# Patient Record
Sex: Female | Born: 1965
Health system: Southern US, Community
[De-identification: ages and names within clinical notes are randomized; demographics above are authoritative.]

## PROBLEM LIST (undated history)

## (undated) DIAGNOSIS — C4491 Basal cell carcinoma of skin, unspecified: Secondary | ICD-10-CM

## (undated) DIAGNOSIS — N809 Endometriosis, unspecified: Secondary | ICD-10-CM

## (undated) DIAGNOSIS — G43909 Migraine, unspecified, not intractable, without status migrainosus: Secondary | ICD-10-CM

## (undated) DIAGNOSIS — B2790 Infectious mononucleosis, unspecified without complication: Secondary | ICD-10-CM

## (undated) DIAGNOSIS — D649 Anemia, unspecified: Secondary | ICD-10-CM

## (undated) DIAGNOSIS — I10 Essential (primary) hypertension: Secondary | ICD-10-CM

## (undated) DIAGNOSIS — F32A Depression, unspecified: Secondary | ICD-10-CM

## (undated) DIAGNOSIS — F329 Major depressive disorder, single episode, unspecified: Secondary | ICD-10-CM

## (undated) DIAGNOSIS — E785 Hyperlipidemia, unspecified: Secondary | ICD-10-CM

## (undated) DIAGNOSIS — N200 Calculus of kidney: Secondary | ICD-10-CM

## (undated) DIAGNOSIS — F419 Anxiety disorder, unspecified: Secondary | ICD-10-CM

## (undated) DIAGNOSIS — G629 Polyneuropathy, unspecified: Secondary | ICD-10-CM

## (undated) HISTORY — DX: Polyneuropathy, unspecified: G62.9

## (undated) HISTORY — DX: Basal cell carcinoma of skin, unspecified: C44.91

## (undated) HISTORY — PX: ABDOMINAL HYSTERECTOMY: SHX81

## (undated) HISTORY — DX: Infectious mononucleosis, unspecified without complication: B27.90

## (undated) HISTORY — DX: Migraine, unspecified, not intractable, without status migrainosus: G43.909

## (undated) HISTORY — PX: DIAGNOSTIC LAPAROSCOPY: SUR761

## (undated) HISTORY — DX: Calculus of kidney: N20.0

## (undated) HISTORY — DX: Hyperlipidemia, unspecified: E78.5

## (undated) HISTORY — DX: Depression, unspecified: F32.A

## (undated) HISTORY — PX: ELBOW SURGERY: SHX618

## (undated) HISTORY — DX: Endometriosis, unspecified: N80.9

## (undated) HISTORY — PX: OTHER SURGICAL HISTORY: SHX169

## (undated) HISTORY — DX: Major depressive disorder, single episode, unspecified: F32.9

## (undated) HISTORY — DX: Anemia, unspecified: D64.9

## (undated) HISTORY — DX: Essential (primary) hypertension: I10

## (undated) HISTORY — PX: LITHOTRIPSY: SUR834

## (undated) HISTORY — DX: Anxiety disorder, unspecified: F41.9

---

## 1992-01-03 DIAGNOSIS — O139 Gestational [pregnancy-induced] hypertension without significant proteinuria, unspecified trimester: Secondary | ICD-10-CM

## 2003-11-11 HISTORY — PX: TOTAL ABDOMINAL HYSTERECTOMY W/ BILATERAL SALPINGOOPHORECTOMY: SHX83

## 2004-07-19 ENCOUNTER — Other Ambulatory Visit: Payer: Self-pay

## 2005-10-29 ENCOUNTER — Ambulatory Visit: Payer: Self-pay | Admitting: Urology

## 2006-02-05 ENCOUNTER — Ambulatory Visit: Payer: Self-pay | Admitting: Urology

## 2006-10-26 ENCOUNTER — Ambulatory Visit: Payer: Self-pay | Admitting: Urology

## 2007-08-19 ENCOUNTER — Ambulatory Visit: Payer: Self-pay | Admitting: Unknown Physician Specialty

## 2007-10-27 ENCOUNTER — Ambulatory Visit: Payer: Self-pay | Admitting: Urology

## 2008-04-06 ENCOUNTER — Ambulatory Visit: Payer: Self-pay | Admitting: Unknown Physician Specialty

## 2008-04-11 ENCOUNTER — Other Ambulatory Visit: Payer: Self-pay

## 2008-04-11 ENCOUNTER — Ambulatory Visit: Payer: Self-pay | Admitting: Unknown Physician Specialty

## 2008-04-13 ENCOUNTER — Ambulatory Visit: Payer: Self-pay | Admitting: Unknown Physician Specialty

## 2008-08-22 ENCOUNTER — Ambulatory Visit: Payer: Self-pay | Admitting: Unknown Physician Specialty

## 2008-09-05 ENCOUNTER — Ambulatory Visit: Payer: Self-pay | Admitting: Urology

## 2008-10-25 ENCOUNTER — Ambulatory Visit: Payer: Self-pay | Admitting: Urology

## 2009-10-24 ENCOUNTER — Ambulatory Visit: Payer: Self-pay | Admitting: Unknown Physician Specialty

## 2009-10-24 ENCOUNTER — Ambulatory Visit: Payer: Self-pay | Admitting: Urology

## 2009-11-01 ENCOUNTER — Ambulatory Visit: Payer: Self-pay | Admitting: Urology

## 2009-11-16 ENCOUNTER — Ambulatory Visit: Payer: Self-pay | Admitting: Urology

## 2010-03-25 ENCOUNTER — Ambulatory Visit: Payer: Self-pay | Admitting: Urology

## 2010-05-03 ENCOUNTER — Ambulatory Visit: Payer: Self-pay | Admitting: Urology

## 2010-10-25 ENCOUNTER — Ambulatory Visit: Payer: Self-pay | Admitting: Unknown Physician Specialty

## 2010-11-10 HISTORY — PX: OTHER SURGICAL HISTORY: SHX169

## 2010-11-10 HISTORY — PX: REDUCTION MAMMAPLASTY: SUR839

## 2010-12-11 ENCOUNTER — Ambulatory Visit: Payer: Self-pay | Admitting: Anesthesiology

## 2010-12-17 ENCOUNTER — Ambulatory Visit: Payer: Self-pay

## 2011-06-02 ENCOUNTER — Ambulatory Visit: Payer: Self-pay | Admitting: Urology

## 2011-10-29 ENCOUNTER — Ambulatory Visit: Payer: Self-pay | Admitting: Unknown Physician Specialty

## 2012-06-09 ENCOUNTER — Ambulatory Visit: Payer: Self-pay | Admitting: Urology

## 2012-11-10 HISTORY — PX: BASAL CELL CARCINOMA EXCISION: SHX1214

## 2013-12-02 ENCOUNTER — Ambulatory Visit: Payer: Self-pay | Admitting: Urology

## 2016-11-18 DIAGNOSIS — M5030 Other cervical disc degeneration, unspecified cervical region: Secondary | ICD-10-CM | POA: Diagnosis not present

## 2016-11-18 DIAGNOSIS — M5412 Radiculopathy, cervical region: Secondary | ICD-10-CM | POA: Diagnosis not present

## 2016-12-04 DIAGNOSIS — M792 Neuralgia and neuritis, unspecified: Secondary | ICD-10-CM | POA: Diagnosis not present

## 2016-12-04 DIAGNOSIS — G894 Chronic pain syndrome: Secondary | ICD-10-CM | POA: Diagnosis not present

## 2016-12-04 DIAGNOSIS — G8928 Other chronic postprocedural pain: Secondary | ICD-10-CM | POA: Diagnosis not present

## 2017-01-02 DIAGNOSIS — J209 Acute bronchitis, unspecified: Secondary | ICD-10-CM | POA: Diagnosis not present

## 2017-01-02 DIAGNOSIS — R69 Illness, unspecified: Secondary | ICD-10-CM | POA: Diagnosis not present

## 2017-01-16 ENCOUNTER — Other Ambulatory Visit: Payer: Self-pay | Admitting: Certified Nurse Midwife

## 2017-01-16 DIAGNOSIS — N951 Menopausal and female climacteric states: Secondary | ICD-10-CM

## 2017-01-20 NOTE — Telephone Encounter (Signed)
Please advise if ok to refill. Pt last seen in 10/17 but due for annual. Thank you.

## 2017-01-21 NOTE — Telephone Encounter (Signed)
Refilled once. Needs annual

## 2017-01-21 NOTE — Telephone Encounter (Signed)
Left msg for pt that refill was sent in and to call and schedule annual exam.

## 2017-02-02 ENCOUNTER — Other Ambulatory Visit: Payer: Self-pay | Admitting: Certified Nurse Midwife

## 2017-02-02 DIAGNOSIS — Z1231 Encounter for screening mammogram for malignant neoplasm of breast: Secondary | ICD-10-CM

## 2017-02-17 DIAGNOSIS — I10 Essential (primary) hypertension: Secondary | ICD-10-CM | POA: Diagnosis not present

## 2017-02-17 DIAGNOSIS — E7800 Pure hypercholesterolemia, unspecified: Secondary | ICD-10-CM | POA: Diagnosis not present

## 2017-02-17 DIAGNOSIS — Z79899 Other long term (current) drug therapy: Secondary | ICD-10-CM | POA: Diagnosis not present

## 2017-02-26 DIAGNOSIS — E7800 Pure hypercholesterolemia, unspecified: Secondary | ICD-10-CM | POA: Diagnosis not present

## 2017-02-26 DIAGNOSIS — M792 Neuralgia and neuritis, unspecified: Secondary | ICD-10-CM | POA: Diagnosis not present

## 2017-02-26 DIAGNOSIS — G894 Chronic pain syndrome: Secondary | ICD-10-CM | POA: Diagnosis not present

## 2017-02-26 DIAGNOSIS — Z79899 Other long term (current) drug therapy: Secondary | ICD-10-CM | POA: Diagnosis not present

## 2017-02-26 DIAGNOSIS — I10 Essential (primary) hypertension: Secondary | ICD-10-CM | POA: Diagnosis not present

## 2017-02-26 DIAGNOSIS — G8928 Other chronic postprocedural pain: Secondary | ICD-10-CM | POA: Diagnosis not present

## 2017-02-27 ENCOUNTER — Telehealth: Payer: Self-pay

## 2017-02-27 DIAGNOSIS — N951 Menopausal and female climacteric states: Secondary | ICD-10-CM

## 2017-02-27 NOTE — Telephone Encounter (Signed)
Pt called needing a refill on estradiol b/c she had to resch her appt to 6/4.  Adv it was refilled c a 90d supply on 3/14 and should be enough to get her through appt.

## 2017-03-05 ENCOUNTER — Ambulatory Visit: Payer: Self-pay | Admitting: Certified Nurse Midwife

## 2017-03-30 ENCOUNTER — Telehealth: Payer: Self-pay | Admitting: Certified Nurse Midwife

## 2017-03-30 NOTE — Telephone Encounter (Signed)
Pt contacted office for refill request on the following medications: estradiol (ESTRACE) 2 MG tablet  [facility] [place]  Pt was scheduled for her annual on 04/13/17 but had to reschedule until 06/09/17 due to starting a new job and she is on her 37 day probation and can't miss work. Pt is requesting enough medication to last until her 06/09/17 appt. Please advise. Thanks TNP

## 2017-03-30 NOTE — Telephone Encounter (Signed)
Please advise for refill. Pt aware [facility] out of the office until Wednesday.

## 2017-03-31 ENCOUNTER — Other Ambulatory Visit: Payer: Self-pay | Admitting: Certified Nurse Midwife

## 2017-03-31 DIAGNOSIS — N951 Menopausal and female climacteric states: Secondary | ICD-10-CM

## 2017-03-31 MED ORDER — ESTRADIOL 2 MG PO TABS: ORAL_TABLET | ORAL | 0 refills | Status: DC

## 2017-03-31 NOTE — Telephone Encounter (Signed)
Patient has appointment end of July for annual. RX for estradiol to be faxed to [place].

## 2017-03-31 NOTE — Telephone Encounter (Signed)
rx sent via escript  

## 2017-04-13 ENCOUNTER — Ambulatory Visit: Payer: Self-pay

## 2017-04-13 ENCOUNTER — Ambulatory Visit: Payer: Self-pay | Admitting: Certified Nurse Midwife

## 2017-05-18 DIAGNOSIS — G894 Chronic pain syndrome: Secondary | ICD-10-CM | POA: Diagnosis not present

## 2017-05-18 DIAGNOSIS — M792 Neuralgia and neuritis, unspecified: Secondary | ICD-10-CM | POA: Diagnosis not present

## 2017-05-18 DIAGNOSIS — G8928 Other chronic postprocedural pain: Secondary | ICD-10-CM | POA: Diagnosis not present

## 2017-06-09 ENCOUNTER — Ambulatory Visit (INDEPENDENT_AMBULATORY_CARE_PROVIDER_SITE_OTHER): Payer: 59 | Admitting: Certified Nurse Midwife

## 2017-06-09 ENCOUNTER — Ambulatory Visit
Admission: RE | Admit: 2017-06-09 | Discharge: 2017-06-09 | Disposition: A | Discharge status: Home / Self Care | Payer: 59 | Source: Ambulatory Visit | Attending: Certified Nurse Midwife | Admitting: Certified Nurse Midwife

## 2017-06-09 ENCOUNTER — Encounter: Payer: Self-pay | Admitting: Certified Nurse Midwife

## 2017-06-09 VITALS — BP 100/70 | HR 60 | Ht 67.0 in | Wt 129.0 lb

## 2017-06-09 DIAGNOSIS — Z1211 Encounter for screening for malignant neoplasm of colon: Secondary | ICD-10-CM | POA: Diagnosis not present

## 2017-06-09 DIAGNOSIS — L292 Pruritus vulvae: Secondary | ICD-10-CM | POA: Diagnosis not present

## 2017-06-09 DIAGNOSIS — Z01419 Encounter for gynecological examination (general) (routine) without abnormal findings: Secondary | ICD-10-CM | POA: Diagnosis not present

## 2017-06-09 DIAGNOSIS — N951 Menopausal and female climacteric states: Secondary | ICD-10-CM

## 2017-06-09 DIAGNOSIS — B3731 Acute candidiasis of vulva and vagina: Secondary | ICD-10-CM

## 2017-06-09 DIAGNOSIS — N941 Unspecified dyspareunia: Secondary | ICD-10-CM | POA: Diagnosis not present

## 2017-06-09 DIAGNOSIS — B373 Candidiasis of vulva and vagina: Secondary | ICD-10-CM

## 2017-06-09 DIAGNOSIS — Z1231 Encounter for screening mammogram for malignant neoplasm of breast: Secondary | ICD-10-CM | POA: Diagnosis not present

## 2017-06-09 MED ORDER — TERCONAZOLE 0.4 % VA CREA: 1.0000 | TOPICAL_CREAM | Freq: Every day | VAGINAL | 0 refills | Status: DC

## 2017-06-09 MED ORDER — CLOTRIMAZOLE-BETAMETHASONE 1-0.05 % EX CREA: 1.0000 "application " | TOPICAL_CREAM | Freq: Two times a day (BID) | CUTANEOUS | 0 refills | Status: DC | PRN

## 2017-06-09 NOTE — Progress Notes (Signed)
Gynecology Annual Exam  PCP:Dr Sparks  Chief Complaint:  Chief Complaint  Patient presents with  . Gynecologic Exam    History of Present Illness:Patricia Love presents today for her annual exam. She is a 51 year old White female , G 1 P 1 0 0 1 , who is s/p TAH and BSO for endometriosis in 2005 and is currently taking estradiol 2 mgm daily. She has been having neuropathic pain which began after her left breast reduction mammoplasty. She is currently on Cymbalta, amytriptylene, topiramate and hydrocodone thru [facility] for neuropathic pain. In the past 2-3 years there were concerns that she may have had Lymes disease in the past after presenting with numbness in her feet which was causing her to fall. She is taking vitamin B12 for this paresthesia which has helped.   She has had no spotting. She does complain of dyspareunia and vulvovaginal itching/ burning after intercourse. This began in October after a rip to the beach. She was seen and evaluated for vaginitis, but the wet prep was negative. She was given Lotrisone for external use. Has not returned for further evaluation until now. Last Pap was prior to Tah/BSO. No history of abnormal Pap smears. The patient's past medical history is also notable for a history of hypertension, hyperlipidemia, depression, and urolithiasis.. She had BCC of the skin on her right wrist.   Since her last annual exam was 12/27/2015, she has had another lithotripsy treatment for renal stones.  She was also diagnosed with DDD of cervical vertebrae. Her most recent mammogram obtained today and results are pending. Prior mammogram on  12/27/2015 was normal and revealed no significant changes. There is no family history of breast cancer. There is a questionable family history of ovarian cancer in her maternal grandmother. Genetic testing has not been done. The patient does not do monthly self breast exams.  A colonoscopy has not been done and patient is  eligible.  She denies a recent DEXA scan and is eligible.  The patient does not smoke.  The patient does not drink alcohol.  The patient does not use illegal drugs.  The patient exercises regularly by walking.  The patient is a vegatarian and gets some calcium in her dietfrom yogart, soy milk, cheese,  and green leafy vegatables.  She had a recent cholesterol screen in 2018 that was normal.       Review of Systems: Review of Systems  Constitutional: Negative for chills, fever and weight loss.  HENT: Negative for congestion, sinus pain and sore throat.   Eyes: Negative for blurred vision and pain.  Respiratory: Negative for hemoptysis, shortness of breath and wheezing.   Cardiovascular: Negative for chest pain, palpitations and leg swelling.  Gastrointestinal: Negative for abdominal pain, blood in stool, diarrhea, heartburn, nausea and vomiting.  Genitourinary: Negative for dysuria, frequency, hematuria and urgency.  Musculoskeletal: Negative for back pain, joint pain and myalgias.  Skin: Negative for itching and rash.  Neurological: Positive for sensory change (and pain in left breast and chest.). Negative for dizziness, tingling and headaches.  Endo/Heme/Allergies: Negative for environmental allergies and polydipsia. Does not bruise/bleed easily.       Negative for hirsutism   Psychiatric/Behavioral: Negative for depression. The patient is not nervous/anxious and does not have insomnia.     Past Medical History:  Past Medical History:  Diagnosis Date  . Anxiety   . Basal cell carcinoma   . Depression   . Endometriosis   .  Randell Patient virus infection   . Hyperlipidemia   . Hypertension   . Kidney stones   . Migraine   . Neuropathy    left breast after reduction mammoplasty, seen at pain clinic    Past Surgical History:  Past Surgical History:  Procedure Laterality Date  . BASAL CELL CARCINOMA EXCISION  2014   right wrist  . DIAGNOSTIC LAPAROSCOPY  [id number]   OSIS  patent tubes  . ELBOW SURGERY    . kidney stone removal    . LITHOTRIPSY     x4; 05/2016  . REDUCTION MAMMAPLASTY  2012  . reduction mammoplasty  2012   Dr. Tula Nakayama  . TOTAL ABDOMINAL HYSTERECTOMY W/ BILATERAL SALPINGOOPHORECTOMY  2005   adenomyosis and severe endometriosis. Dr Rayford Halsted    Family History:  Family History  Problem Relation Age of Onset  . Hyperlipidemia Mother   . Hypertension Mother   . Depression Mother   . Stroke Mother   . Hypertension Father   . Diabetes Father   . Hyperlipidemia Sister   . Hypertension Sister   . Depression Sister   . Ovarian cancer Maternal Grandmother 74  . Breast cancer Neg Hx     Social History:  Social History   Social History  . Marital status: Divorced    Spouse name: N/A  . Number of children: 1  . Years of education: 24   Occupational History  . Permit Specialist    Social History Main Topics  . Smoking status: Never Smoker  . Smokeless tobacco: Never Used  . Alcohol use No  . Drug use: No  . Sexual activity: Yes    Birth control/ protection: Surgical   Other Topics Concern  . Not on file   Social History Narrative  . No narrative on file    Allergies:  Allergies  Allergen Reactions  . Contrast Media  [Iodinated Diagnostic Agents] Anaphylaxis  . Meloxicam Swelling  . Pregabalin Other (See Comments)  . Atorvastatin Other (See Comments)    Elevated cholesterol  . Butorphanol Tartrate Other (See Comments)    Patient states she is not allergic to this  . Gabapentin   . Gemfibrozil Other (See Comments)  . Verapamil Other (See Comments)  . Codeine Sulfate Nausea Only    Medications:  Current Outpatient Prescriptions:  .  ALPRAZolam (XANAX) 0.5 MG tablet, Take 1 tablet by mouth 3 (three) times daily before meals., Disp: , Rfl:  .  amitriptyline (ELAVIL) 25 MG tablet, Take 1 tablet by mouth daily., Disp: , Rfl:  .  atenolol (TENORMIN) 50 MG tablet, Take 1 tablet by mouth daily., Disp: , Rfl:  .  buPROPion  (WELLBUTRIN SR) 150 MG 12 hr tablet, Take 150 mg by mouth 2 (two) times daily., Disp: , Rfl:  .  butalbital-acetaminophen-caffeine (FIORICET, ESGIC) 50-325-40 MG tablet, Take 2 tablets by mouth every 4 (four) hours as needed., Disp: , Rfl:  .  DULoxetine (CYMBALTA) 30 MG capsule, Take 1 capsule by mouth daily., Disp: , Rfl:  .  ezetimibe (ZETIA) 10 MG tablet, Take 1 tablet by mouth daily., Disp: , Rfl:  .  furosemide (LASIX) 40 MG tablet, Take 1.5 tablets by mouth daily., Disp: , Rfl:  .  HYDROcodone-acetaminophen (NORCO) 10-325 MG tablet, Take 1 tablet by mouth every 6 (six) hours as needed., Disp: , Rfl:  .  potassium chloride SA (K-DUR,KLOR-CON) 20 MEQ tablet, Take 4 tablets by mouth every morning., Disp: , Rfl:  .  potassium citrate (UROCIT-K) 10 MEQ (  1080 MG) SR tablet, Take 2 tablets by mouth every morning., Disp: , Rfl:  .  promethazine (PHENERGAN) 25 MG tablet, Take 1 tablet by mouth every 6 (six) hours as needed., Disp: , Rfl:  .  rosuvastatin (CRESTOR) 20 MG tablet, Take 1 tablet by mouth daily., Disp: , Rfl:  .  SUMAtriptan (IMITREX) 100 MG tablet, Take 1 tablet by mouth daily as needed., Disp: , Rfl:  .  topiramate (TOPAMAX) 25 MG tablet, Take 3 tablets by mouth 2 (two) times daily., Disp: , Rfl:  .  clotrimazole-betamethasone (LOTRISONE) cream, Apply 1 application topically 2 (two) times daily as needed., Disp: 30 g, Rfl: 0 .  estradiol (ESTRACE) 2 MG tablet, TAKE ONE (1) TABLET BY MOUTH EVERY DAY, Disp: 90 tablet, Rfl: 0 .  terconazole (TERAZOL 7) 0.4 % vaginal cream, Place 1 applicator vaginally at bedtime., Disp: 45 g, Rfl: 0  Physical Exam Vitals: BP 100/70 (Patient Position: Sitting)   Pulse 60   Ht 5\' 7"  (1.702 m)   Wt 129 lb (58.5 kg)   BMI 20.20 kg/m   General:WF in  NAD HEENT: normocephalic, anicteric Neck: no thyroid enlargement, no palpable nodules, no cervical lymphadenopathy  Pulmonary: No increased work of breathing, CTAB Cardiovascular: RRR, without murmur    Breast: Breast symmetrical, no masses, scars from mammoplasty present,  no skin or nipple retraction present, no nipple discharge. Tenderness on left with palpation. No axillary, infraclavicular or supraclavicular lymphadenopathy. Abdomen: Soft, non-tender, non-distended.  Umbilicus without lesions.  No hepatomegaly or masses palpable. No evidence of hernia. Genitourinary:  External: Inflamed vestibule  Normal urethral meatus, normal Bartholin's and Skene's glands.    Vagina: white curdlike discharge    Cervix: surgically absent  Uterus: surgically absent  Adnexa: No adnexal masses, non-tender  Rectal: no masses, hemoccult negative  Lymphatic: no evidence of inguinal lymphadenopathy Extremities: no edema, erythema, or tenderness Neurologic: Grossly intact Psychiatric: mood appropriate, affect full  Results for orders placed or performed in visit on 06/09/17 (from the past 24 hour(s))  POCT Wet Prep [facility] [place])     Status: Abnormal   Collection Time: 06/10/17  9:19 PM  Result Value Ref Range   Source Wet Prep POC vaginal    WBC, Wet Prep HPF POC     Bacteria Wet Prep HPF POC  Few   BACTERIA WET PREP MORPHOLOGY POC     Clue Cells Wet Prep HPF POC None None   Clue Cells Wet Prep Whiff POC     Yeast Wet Prep HPF POC Many    KOH Wet Prep POC     Trichomonas Wet Prep HPF POC Absent Absent  POCT Occult Blood Stool     Status: Normal   Collection Time: 06/10/17  9:20 PM  Result Value Ref Range   Fecal Occult Blood, POC Negative Negative   Card #1 Date     Card #2 Fecal Occult Blod, POC     Card #2 Date     Card #3 Fecal Occult Blood, POC     Card #3 Date     Wet prep: also remarkable for mostly superficial cells  Assessment: 51 y.o. G1P1001 annual gyn exam Postmenopausal female on ET Monilial vulvovaginitis   Plan:  1) Breast cancer screening - recommend monthly self breast exam and annual mammograms.. Mammogram is up to date. Results pending.  2) Refill of estradiol 2 mgm  daily #90/ RF x3. RX for Terazol 7 cream and Lotrisone sent to pharmacy.  3) Cervical cancer  screening - Pap not indicated. ASCCP guidelines and rational discussed.  Patient opts for no further pap smears.  4) Colon cancer screening options discussed: Cologuard, colonoscopy, annual hemoccult. Patient opts for annual hemoccult. Hemoccult done and was negative.  5) Routine healthcare maintenance including cholesterol and diabetes screening managed by PCP   6) RTO 1 year and prn persistent vaginitis symptoms.  Dalia Heading, CNM

## 2017-06-10 ENCOUNTER — Encounter: Payer: Self-pay | Admitting: Certified Nurse Midwife

## 2017-06-10 DIAGNOSIS — F329 Major depressive disorder, single episode, unspecified: Secondary | ICD-10-CM | POA: Insufficient documentation

## 2017-06-10 DIAGNOSIS — G43909 Migraine, unspecified, not intractable, without status migrainosus: Secondary | ICD-10-CM | POA: Insufficient documentation

## 2017-06-10 DIAGNOSIS — I10 Essential (primary) hypertension: Secondary | ICD-10-CM | POA: Insufficient documentation

## 2017-06-10 DIAGNOSIS — G629 Polyneuropathy, unspecified: Secondary | ICD-10-CM | POA: Insufficient documentation

## 2017-06-10 DIAGNOSIS — F32A Depression, unspecified: Secondary | ICD-10-CM | POA: Insufficient documentation

## 2017-06-10 DIAGNOSIS — C4491 Basal cell carcinoma of skin, unspecified: Secondary | ICD-10-CM | POA: Insufficient documentation

## 2017-06-10 DIAGNOSIS — F419 Anxiety disorder, unspecified: Secondary | ICD-10-CM | POA: Insufficient documentation

## 2017-06-10 DIAGNOSIS — N200 Calculus of kidney: Secondary | ICD-10-CM | POA: Insufficient documentation

## 2017-06-10 DIAGNOSIS — E785 Hyperlipidemia, unspecified: Secondary | ICD-10-CM | POA: Insufficient documentation

## 2017-06-10 LAB — POCT WET PREP (WET MOUNT): Trichomonas Wet Prep HPF POC: ABSENT

## 2017-06-10 LAB — HEMOCCULT GUIAC POC 1CARD (OFFICE): Fecal Occult Blood, POC: NEGATIVE

## 2017-06-10 MED ORDER — ESTRADIOL 2 MG PO TABS: ORAL_TABLET | ORAL | 3 refills | Status: DC

## 2017-06-11 ENCOUNTER — Other Ambulatory Visit: Payer: Self-pay | Admitting: *Deleted

## 2017-06-11 ENCOUNTER — Inpatient Hospital Stay
Admission: RE | Admit: 2017-06-11 | Discharge: 2017-06-11 | Disposition: A | Discharge status: Home / Self Care | Payer: Self-pay | Source: Ambulatory Visit | Attending: *Deleted | Admitting: *Deleted

## 2017-06-11 DIAGNOSIS — Z9289 Personal history of other medical treatment: Secondary | ICD-10-CM

## 2017-06-12 ENCOUNTER — Other Ambulatory Visit: Payer: Self-pay | Admitting: Certified Nurse Midwife

## 2017-06-12 ENCOUNTER — Telehealth: Payer: Self-pay

## 2017-06-12 MED ORDER — TERCONAZOLE 80 MG VA SUPP: 80.0000 mg | Freq: Every day | VAGINAL | 0 refills | Status: DC

## 2017-06-12 NOTE — Telephone Encounter (Signed)
She has not completed the Terazol 7 yet. Is the external irritation worse or the internal irritation?

## 2017-06-12 NOTE — Telephone Encounter (Signed)
She states it is more like burning, mostly on outside but also kind of inside out. Pharmacy [facility] [place]

## 2017-06-12 NOTE — Telephone Encounter (Signed)
Please advise 

## 2017-06-12 NOTE — Telephone Encounter (Signed)
Pt was seen last Tues for yeast inf, was given med, told to call if didn't help, has gotten worse.  Please call.  [phone].

## 2017-06-13 NOTE — Telephone Encounter (Signed)
She seems to be burning vaginally after using the Terazol cream. Will switch to Terazol 3 suppositories and see if better tolerated.

## 2017-06-22 ENCOUNTER — Other Ambulatory Visit: Payer: Self-pay

## 2017-06-22 DIAGNOSIS — N951 Menopausal and female climacteric states: Secondary | ICD-10-CM

## 2017-06-22 MED ORDER — ESTRADIOL 2 MG PO TABS: ORAL_TABLET | ORAL | 3 refills | Status: DC

## 2017-08-04 ENCOUNTER — Telehealth: Payer: Self-pay | Admitting: Certified Nurse Midwife

## 2017-08-04 NOTE — Telephone Encounter (Signed)
Pt is calling needing to speak Patricia Love about her consistent yeast infection. Please have Colleen call patient

## 2017-08-05 ENCOUNTER — Other Ambulatory Visit: Payer: Self-pay | Admitting: Certified Nurse Midwife

## 2017-08-05 MED ORDER — FLUCONAZOLE 150 MG PO TABS: ORAL_TABLET | ORAL | 0 refills | Status: DC

## 2017-08-05 NOTE — Telephone Encounter (Signed)
Was seen the end of July and given a RX for [employer] 7 cream-which she did not tolerate. Terazol 3 suppositories then used, but itching and discharge never resolved. Uses Lotrisone externally with short term relief. Will try Diflucan 150 mgm x doses and see if symptoms resolve. I called patient and spoke to her. RX e prescribed.

## 2017-08-16 DIAGNOSIS — Z23 Encounter for immunization: Secondary | ICD-10-CM | POA: Diagnosis not present

## 2017-08-20 DIAGNOSIS — E7800 Pure hypercholesterolemia, unspecified: Secondary | ICD-10-CM | POA: Diagnosis not present

## 2017-08-20 DIAGNOSIS — G8928 Other chronic postprocedural pain: Secondary | ICD-10-CM | POA: Diagnosis not present

## 2017-08-20 DIAGNOSIS — Z79899 Other long term (current) drug therapy: Secondary | ICD-10-CM | POA: Diagnosis not present

## 2017-08-20 DIAGNOSIS — M792 Neuralgia and neuritis, unspecified: Secondary | ICD-10-CM | POA: Diagnosis not present

## 2017-08-20 DIAGNOSIS — Z5181 Encounter for therapeutic drug level monitoring: Secondary | ICD-10-CM | POA: Diagnosis not present

## 2017-08-20 DIAGNOSIS — G894 Chronic pain syndrome: Secondary | ICD-10-CM | POA: Diagnosis not present

## 2017-08-20 DIAGNOSIS — Z1329 Encounter for screening for other suspected endocrine disorder: Secondary | ICD-10-CM | POA: Diagnosis not present

## 2017-08-20 DIAGNOSIS — Z131 Encounter for screening for diabetes mellitus: Secondary | ICD-10-CM | POA: Diagnosis not present

## 2017-08-20 DIAGNOSIS — I10 Essential (primary) hypertension: Secondary | ICD-10-CM | POA: Diagnosis not present

## 2017-08-27 DIAGNOSIS — E7800 Pure hypercholesterolemia, unspecified: Secondary | ICD-10-CM | POA: Diagnosis not present

## 2017-08-27 DIAGNOSIS — N182 Chronic kidney disease, stage 2 (mild): Secondary | ICD-10-CM | POA: Diagnosis not present

## 2017-08-27 DIAGNOSIS — I10 Essential (primary) hypertension: Secondary | ICD-10-CM | POA: Diagnosis not present

## 2017-11-17 DIAGNOSIS — M792 Neuralgia and neuritis, unspecified: Secondary | ICD-10-CM | POA: Diagnosis not present

## 2017-11-17 DIAGNOSIS — L820 Inflamed seborrheic keratosis: Secondary | ICD-10-CM | POA: Diagnosis not present

## 2017-11-17 DIAGNOSIS — G894 Chronic pain syndrome: Secondary | ICD-10-CM | POA: Diagnosis not present

## 2017-11-17 DIAGNOSIS — G8928 Other chronic postprocedural pain: Secondary | ICD-10-CM | POA: Diagnosis not present

## 2018-01-14 DIAGNOSIS — R509 Fever, unspecified: Secondary | ICD-10-CM | POA: Diagnosis not present

## 2018-01-14 DIAGNOSIS — J0191 Acute recurrent sinusitis, unspecified: Secondary | ICD-10-CM | POA: Diagnosis not present

## 2018-02-09 ENCOUNTER — Other Ambulatory Visit: Payer: Self-pay | Admitting: Certified Nurse Midwife

## 2018-02-09 DIAGNOSIS — N951 Menopausal and female climacteric states: Secondary | ICD-10-CM

## 2018-02-09 DIAGNOSIS — M792 Neuralgia and neuritis, unspecified: Secondary | ICD-10-CM | POA: Diagnosis not present

## 2018-02-09 DIAGNOSIS — G8928 Other chronic postprocedural pain: Secondary | ICD-10-CM | POA: Diagnosis not present

## 2018-02-09 DIAGNOSIS — Z5181 Encounter for therapeutic drug level monitoring: Secondary | ICD-10-CM | POA: Diagnosis not present

## 2018-02-09 DIAGNOSIS — G894 Chronic pain syndrome: Secondary | ICD-10-CM | POA: Diagnosis not present

## 2018-02-09 NOTE — Telephone Encounter (Signed)
Please advise for refill. Thank you.  

## 2018-02-18 DIAGNOSIS — R7309 Other abnormal glucose: Secondary | ICD-10-CM | POA: Diagnosis not present

## 2018-02-18 DIAGNOSIS — I10 Essential (primary) hypertension: Secondary | ICD-10-CM | POA: Diagnosis not present

## 2018-02-18 DIAGNOSIS — E7800 Pure hypercholesterolemia, unspecified: Secondary | ICD-10-CM | POA: Diagnosis not present

## 2018-02-18 DIAGNOSIS — Z79899 Other long term (current) drug therapy: Secondary | ICD-10-CM | POA: Diagnosis not present

## 2018-02-25 DIAGNOSIS — Z0000 Encounter for general adult medical examination without abnormal findings: Secondary | ICD-10-CM | POA: Diagnosis not present

## 2018-02-25 DIAGNOSIS — E7800 Pure hypercholesterolemia, unspecified: Secondary | ICD-10-CM | POA: Diagnosis not present

## 2018-02-25 DIAGNOSIS — N182 Chronic kidney disease, stage 2 (mild): Secondary | ICD-10-CM | POA: Diagnosis not present

## 2018-04-07 DIAGNOSIS — R7989 Other specified abnormal findings of blood chemistry: Secondary | ICD-10-CM | POA: Diagnosis not present

## 2018-04-07 DIAGNOSIS — R3129 Other microscopic hematuria: Secondary | ICD-10-CM | POA: Diagnosis not present

## 2018-04-07 DIAGNOSIS — E785 Hyperlipidemia, unspecified: Secondary | ICD-10-CM | POA: Diagnosis not present

## 2018-04-07 DIAGNOSIS — N200 Calculus of kidney: Secondary | ICD-10-CM | POA: Diagnosis not present

## 2018-04-30 DIAGNOSIS — M792 Neuralgia and neuritis, unspecified: Secondary | ICD-10-CM | POA: Diagnosis not present

## 2018-04-30 DIAGNOSIS — G894 Chronic pain syndrome: Secondary | ICD-10-CM | POA: Diagnosis not present

## 2018-04-30 DIAGNOSIS — G8928 Other chronic postprocedural pain: Secondary | ICD-10-CM | POA: Diagnosis not present

## 2018-05-03 DIAGNOSIS — J019 Acute sinusitis, unspecified: Secondary | ICD-10-CM | POA: Diagnosis not present

## 2018-05-27 DIAGNOSIS — I10 Essential (primary) hypertension: Secondary | ICD-10-CM | POA: Diagnosis not present

## 2018-08-03 ENCOUNTER — Other Ambulatory Visit: Payer: Self-pay | Admitting: Certified Nurse Midwife

## 2018-08-04 DIAGNOSIS — G894 Chronic pain syndrome: Secondary | ICD-10-CM | POA: Diagnosis not present

## 2018-08-04 DIAGNOSIS — G8928 Other chronic postprocedural pain: Secondary | ICD-10-CM | POA: Diagnosis not present

## 2018-08-04 DIAGNOSIS — M792 Neuralgia and neuritis, unspecified: Secondary | ICD-10-CM | POA: Diagnosis not present

## 2018-08-23 DIAGNOSIS — Z23 Encounter for immunization: Secondary | ICD-10-CM | POA: Diagnosis not present

## 2018-08-27 ENCOUNTER — Emergency Department
Admission: EM | Admit: 2018-08-27 | Discharge: 2018-08-27 | Disposition: A | Discharge status: Home / Self Care | Payer: Commercial Managed Care - HMO | Attending: Emergency Medicine | Admitting: Emergency Medicine

## 2018-08-27 ENCOUNTER — Emergency Department: Payer: Commercial Managed Care - HMO

## 2018-08-27 ENCOUNTER — Other Ambulatory Visit: Payer: Self-pay

## 2018-08-27 ENCOUNTER — Encounter: Payer: Self-pay | Admitting: Emergency Medicine

## 2018-08-27 DIAGNOSIS — S161XXA Strain of muscle, fascia and tendon at neck level, initial encounter: Secondary | ICD-10-CM | POA: Diagnosis not present

## 2018-08-27 DIAGNOSIS — M546 Pain in thoracic spine: Secondary | ICD-10-CM | POA: Diagnosis not present

## 2018-08-27 DIAGNOSIS — I10 Essential (primary) hypertension: Secondary | ICD-10-CM | POA: Insufficient documentation

## 2018-08-27 DIAGNOSIS — R079 Chest pain, unspecified: Secondary | ICD-10-CM | POA: Diagnosis not present

## 2018-08-27 DIAGNOSIS — S299XXA Unspecified injury of thorax, initial encounter: Secondary | ICD-10-CM | POA: Diagnosis not present

## 2018-08-27 DIAGNOSIS — Y929 Unspecified place or not applicable: Secondary | ICD-10-CM | POA: Diagnosis not present

## 2018-08-27 DIAGNOSIS — Z79899 Other long term (current) drug therapy: Secondary | ICD-10-CM | POA: Insufficient documentation

## 2018-08-27 DIAGNOSIS — Y939 Activity, unspecified: Secondary | ICD-10-CM | POA: Insufficient documentation

## 2018-08-27 DIAGNOSIS — S199XXA Unspecified injury of neck, initial encounter: Secondary | ICD-10-CM | POA: Diagnosis not present

## 2018-08-27 DIAGNOSIS — Y999 Unspecified external cause status: Secondary | ICD-10-CM | POA: Diagnosis not present

## 2018-08-27 DIAGNOSIS — R0789 Other chest pain: Secondary | ICD-10-CM | POA: Diagnosis not present

## 2018-08-27 DIAGNOSIS — M542 Cervicalgia: Secondary | ICD-10-CM | POA: Diagnosis not present

## 2018-08-27 LAB — CBC
HCT: 40.9 % (ref 36.0–46.0)
HEMOGLOBIN: 13.5 g/dL (ref 12.0–15.0)
MCH: 31 pg (ref 26.0–34.0)
MCHC: 33 g/dL (ref 30.0–36.0)
MCV: 93.8 fL (ref 80.0–100.0)
Platelets: 264 10*3/uL (ref 150–400)
RBC: 4.36 MIL/uL (ref 3.87–5.11)
RDW: 12.6 % (ref 11.5–15.5)
WBC: 6.9 10*3/uL (ref 4.0–10.5)
nRBC: 0 % (ref 0.0–0.2)

## 2018-08-27 LAB — COMPREHENSIVE METABOLIC PANEL
ALBUMIN: 4.2 g/dL (ref 3.5–5.0)
ALK PHOS: 59 U/L (ref 38–126)
ALT: 30 U/L (ref 0–44)
ANION GAP: 8 (ref 5–15)
AST: 28 U/L (ref 15–41)
BILIRUBIN TOTAL: 0.4 mg/dL (ref 0.3–1.2)
BUN: 13 mg/dL (ref 6–20)
CALCIUM: 8.6 mg/dL — AB (ref 8.9–10.3)
CO2: 25 mmol/L (ref 22–32)
Chloride: 107 mmol/L (ref 98–111)
Creatinine, Ser: 1.28 mg/dL — ABNORMAL HIGH (ref 0.44–1.00)
GFR calc Af Amer: 55 mL/min — ABNORMAL LOW (ref >60)
GFR calc non Af Amer: 48 mL/min — ABNORMAL LOW (ref >60)
GLUCOSE: 100 mg/dL — AB (ref 70–99)
POTASSIUM: 4.4 mmol/L (ref 3.5–5.1)
SODIUM: 140 mmol/L (ref 135–145)
TOTAL PROTEIN: 7.2 g/dL (ref 6.5–8.1)

## 2018-08-27 LAB — TROPONIN I: Troponin I: <0.03 ng/mL (ref <0.03)

## 2018-08-27 NOTE — ED Triage Notes (Signed)
Pt states she has pain contract Dr. Sanjuan Dame at [facility]

## 2018-08-27 NOTE — ED Notes (Signed)
Discussed with Dr. Corky Downs, new orders received for labs and XR, informed him already have EKG.
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
## 2018-09-01 ENCOUNTER — Ambulatory Visit: Payer: 59 | Admitting: Obstetrics and Gynecology

## 2018-09-01 DIAGNOSIS — N182 Chronic kidney disease, stage 2 (mild): Secondary | ICD-10-CM | POA: Diagnosis not present

## 2018-09-01 DIAGNOSIS — I10 Essential (primary) hypertension: Secondary | ICD-10-CM | POA: Diagnosis not present

## 2018-09-01 DIAGNOSIS — R7309 Other abnormal glucose: Secondary | ICD-10-CM | POA: Diagnosis not present

## 2018-09-06 ENCOUNTER — Other Ambulatory Visit: Payer: Self-pay | Admitting: Certified Nurse Midwife

## 2018-09-06 DIAGNOSIS — N951 Menopausal and female climacteric states: Secondary | ICD-10-CM

## 2018-09-13 ENCOUNTER — Ambulatory Visit: Payer: 59 | Admitting: Obstetrics and Gynecology

## 2018-09-20 DIAGNOSIS — I10 Essential (primary) hypertension: Secondary | ICD-10-CM | POA: Diagnosis not present

## 2018-09-20 DIAGNOSIS — Z79899 Other long term (current) drug therapy: Secondary | ICD-10-CM | POA: Diagnosis not present

## 2018-09-20 DIAGNOSIS — R7309 Other abnormal glucose: Secondary | ICD-10-CM | POA: Diagnosis not present

## 2018-09-20 DIAGNOSIS — E7800 Pure hypercholesterolemia, unspecified: Secondary | ICD-10-CM | POA: Diagnosis not present

## 2018-09-21 DIAGNOSIS — R8290 Unspecified abnormal findings in urine: Secondary | ICD-10-CM | POA: Diagnosis not present

## 2018-11-16 DIAGNOSIS — M792 Neuralgia and neuritis, unspecified: Secondary | ICD-10-CM | POA: Diagnosis not present

## 2018-11-16 DIAGNOSIS — G894 Chronic pain syndrome: Secondary | ICD-10-CM | POA: Diagnosis not present

## 2018-11-16 DIAGNOSIS — G8928 Other chronic postprocedural pain: Secondary | ICD-10-CM | POA: Diagnosis not present

## 2018-11-16 DIAGNOSIS — Z5181 Encounter for therapeutic drug level monitoring: Secondary | ICD-10-CM | POA: Diagnosis not present

## 2018-11-23 ENCOUNTER — Other Ambulatory Visit (HOSPITAL_COMMUNITY)
Admission: RE | Admit: 2018-11-23 | Discharge: 2018-11-23 | Disposition: A | Discharge status: Home / Self Care | Payer: 59 | Source: Ambulatory Visit | Attending: Obstetrics and Gynecology | Admitting: Obstetrics and Gynecology

## 2018-11-23 ENCOUNTER — Encounter: Payer: Self-pay | Admitting: Obstetrics and Gynecology

## 2018-11-23 ENCOUNTER — Ambulatory Visit (INDEPENDENT_AMBULATORY_CARE_PROVIDER_SITE_OTHER): Payer: 59 | Admitting: Obstetrics and Gynecology

## 2018-11-23 VITALS — BP 80/60 | HR 50 | Ht 67.0 in | Wt 122.0 lb

## 2018-11-23 DIAGNOSIS — Z1151 Encounter for screening for human papillomavirus (HPV): Secondary | ICD-10-CM

## 2018-11-23 DIAGNOSIS — Z1239 Encounter for other screening for malignant neoplasm of breast: Secondary | ICD-10-CM | POA: Diagnosis not present

## 2018-11-23 DIAGNOSIS — Z1211 Encounter for screening for malignant neoplasm of colon: Secondary | ICD-10-CM

## 2018-11-23 DIAGNOSIS — Z01419 Encounter for gynecological examination (general) (routine) without abnormal findings: Secondary | ICD-10-CM | POA: Diagnosis not present

## 2018-11-23 DIAGNOSIS — Z124 Encounter for screening for malignant neoplasm of cervix: Secondary | ICD-10-CM

## 2018-11-23 DIAGNOSIS — N951 Menopausal and female climacteric states: Secondary | ICD-10-CM

## 2018-11-23 LAB — HEMOCCULT GUIAC POC 1CARD (OFFICE): Fecal Occult Blood, POC: NEGATIVE

## 2018-11-23 MED ORDER — ESTRADIOL 2 MG PO TABS: 2.0000 mg | ORAL_TABLET | Freq: Every day | ORAL | 3 refills | Status: DC

## 2018-11-23 NOTE — Patient Instructions (Addendum)
I value your feedback and entrusting us with your care. If you get a  patient survey, I would appreciate you taking the time to let us know about your experience today. Thank you!  [facility]: [phone]    

## 2018-11-23 NOTE — Progress Notes (Signed)
PCP: Idelle Duty, MD   Chief Complaint  Patient presents with  . Gynecologic Exam    HPI:      [name] is a 54 y.o. G1P1001 who LMP was No LMP recorded. Patient has had a hysterectomy., presents today for her annual examination.  Her menses are absent due to [facility] for endometriosis in 2005. She does not have intermenstrual bleeding.  She does not have vasomotor sx. She takes estradiol 2 mg daily with sx relief.   Sex activity: not sexually active. She did have vaginal dryness relieved with lubricants.  Last Pap: not recent; no hx of abn   Hx of STDs: none  Last mammogram: June 09, 2017  Results were: normal--routine follow-up in 12 months There is no FH of breast cancer. There is no FH of ovarian cancer. The patient does not do self-breast exams. S/p breast reduction. Has neuropathy and is on pain meds from clinic.   Colonoscopy: never; pt's PCP wants to delay any more stress to pt's body for now. Neg FOBT yearly.   Tobacco use: The patient denies current or previous tobacco use. Alcohol use: none Exercise: moderately active  She does get adequate calcium and Vitamin D in her diet.  Labs with PCP.   Past Medical History:  Diagnosis Date  . Anxiety   . Basal cell carcinoma   . Depression   . Endometriosis   . [facility] Patient virus infection   . Hyperlipidemia   . Hypertension   . Kidney stones   . Migraine   . Neuropathy    left breast after reduction mammoplasty, seen at pain clinic    Past Surgical History:  Procedure Laterality Date  . BASAL CELL CARCINOMA EXCISION  2014   right wrist  . DIAGNOSTIC LAPAROSCOPY  [id number]   OSIS patent tubes  . ELBOW SURGERY    . kidney stone removal    . LITHOTRIPSY     x4; 05/2016  . REDUCTION MAMMAPLASTY  2012  . reduction mammoplasty  2012   Dr. Tula Nakayama  . TOTAL ABDOMINAL HYSTERECTOMY W/ BILATERAL SALPINGOOPHORECTOMY  2005   adenomyosis and severe endometriosis. Dr Rayford Halsted    Family History  Problem  Relation Age of Onset  . Hyperlipidemia Mother   . Hypertension Mother   . Depression Mother   . Stroke Mother   . Hypertension Father   . Diabetes Father   . Hyperlipidemia Sister   . Hypertension Sister   . Depression Sister   . Ovarian cancer Maternal Grandmother 65  . Breast cancer Neg Hx     Social History   Socioeconomic History  . Marital status: Divorced    Spouse name: Not on file  . Number of children: 1  . Years of education: 62  . Highest education level: Not on file  Occupational History  . Occupation: Location manager  Social Needs  . Financial resource strain: Not on file  . Food insecurity:    Worry: Not on file    Inability: Not on file  . Transportation needs:    Medical: Not on file    Non-medical: Not on file  Tobacco Use  . Smoking status: Never Smoker  . Smokeless tobacco: Never Used  Substance and Sexual Activity  . Alcohol use: No  . Drug use: No  . Sexual activity: Yes    Birth control/protection: Surgical    Comment: Hysterectomy  Lifestyle  . Physical activity:    Days per week: Not on  file    Minutes per session: Not on file  . Stress: Not on file  Relationships  . Social connections:    Talks on phone: Not on file    Gets together: Not on file    Attends religious service: Not on file    Active member of club or organization: Not on file    Attends meetings of clubs or organizations: Not on file    Relationship status: Not on file  . Intimate partner violence:    Fear of current or ex partner: Not on file    Emotionally abused: Not on file    Physically abused: Not on file    Forced sexual activity: Not on file  Other Topics Concern  . Not on file  Social History Narrative  . Not on file    Outpatient Medications Prior to Visit  Medication Sig Dispense Refill  . ALPRAZolam (XANAX) 0.5 MG tablet Take 1 tablet by mouth 3 (three) times daily before meals.    . ALPRAZolam (XANAX) 0.5 MG tablet Take by mouth.    Marland Kitchen  amitriptyline (ELAVIL) 25 MG tablet Take 1 tablet by mouth daily.    Marland Kitchen atenolol (TENORMIN) 50 MG tablet Take 1 tablet by mouth daily.    Marland Kitchen buPROPion (WELLBUTRIN SR) 150 MG 12 hr tablet Take 150 mg by mouth 2 (two) times daily.    . butalbital-acetaminophen-caffeine (FIORICET, ESGIC) 50-325-40 MG tablet Take 2 tablets by mouth every 4 (four) hours as needed.    . DULoxetine (CYMBALTA) 30 MG capsule Take 1 capsule by mouth daily.    Marland Kitchen ezetimibe (ZETIA) 10 MG tablet Take 1 tablet by mouth daily.    . furosemide (LASIX) 40 MG tablet Take 1.5 tablets by mouth daily.    Marland Kitchen HYDROcodone-acetaminophen (NORCO) 10-325 MG tablet Take 1 tablet by mouth every 6 (six) hours as needed.    . potassium chloride SA (K-DUR,KLOR-CON) 20 MEQ tablet Take 4 tablets by mouth every morning.    . potassium citrate (UROCIT-K) 10 MEQ (1080 MG) SR tablet Take 2 tablets by mouth every morning.    . promethazine (PHENERGAN) 25 MG tablet Take 1 tablet by mouth every 6 (six) hours as needed.    . rosuvastatin (CRESTOR) 20 MG tablet Take 1 tablet by mouth daily.    . SUMAtriptan (IMITREX) 100 MG tablet Take 1 tablet by mouth daily as needed.    . tamsulosin (FLOMAX) 0.4 MG CAPS capsule Take by mouth.    . terconazole (TERAZOL 3) 80 MG vaginal suppository Place 1 suppository (80 mg total) vaginally at bedtime. 3 suppository 0  . topiramate (TOPAMAX) 25 MG tablet Take 3 tablets by mouth 2 (two) times daily.    Marland Kitchen estradiol (ESTRACE) 2 MG tablet TAKE 1 TABLET BY MOUTH  EVERY DAY 90 tablet 1  . clotrimazole-betamethasone (LOTRISONE) cream Apply 1 application topically 2 (two) times daily as needed. 30 g 0  . fluconazole (DIFLUCAN) 150 MG tablet Take one tablet every 3 days x 2 doses 2 tablet 0   No facility-administered medications prior to visit.        ROS:  Review of Systems  Constitutional: Negative for fatigue, fever and unexpected weight change.  Respiratory: Negative for cough, shortness of breath and wheezing.     Cardiovascular: Negative for chest pain, palpitations and leg swelling.  Gastrointestinal: Negative for blood in stool, constipation, diarrhea, nausea and vomiting.  Endocrine: Negative for cold intolerance, heat intolerance and polyuria.  Genitourinary: Negative for dyspareunia,  dysuria, flank pain, frequency, genital sores, hematuria, menstrual problem, pelvic pain, urgency, vaginal bleeding, vaginal discharge and vaginal pain.  Musculoskeletal: Negative for back pain, joint swelling and myalgias.  Skin: Negative for rash.  Neurological: Negative for dizziness, syncope, light-headedness, numbness and headaches.  Hematological: Negative for adenopathy.  Psychiatric/Behavioral: Negative for agitation, confusion, sleep disturbance and suicidal ideas. The patient is not nervous/anxious.    BREAST: No symptoms    Objective: BP (!) 80/60   Pulse (!) 50   Ht 5\' 7"  (1.702 m)   Wt 122 lb (55.3 kg)   BMI 19.11 kg/m    Physical Exam Constitutional:      Appearance: She is well-developed.  Genitourinary:     Vulva and vagina normal.     No vaginal discharge, erythema or tenderness.     Cervix is absent.     Uterus is absent.     No right or left adnexal mass present.     Right adnexa absent.     Right adnexa not tender.     Left adnexa absent.     Left adnexa not tender.     Genitourinary Comments: UTERUS/CX SURG REM  Neck:     Musculoskeletal: Normal range of motion.     Thyroid: No thyromegaly.  Cardiovascular:     Rate and Rhythm: Normal rate and regular rhythm.     Heart sounds: Normal heart sounds. No murmur.  Pulmonary:     Effort: Pulmonary effort is normal.     Breath sounds: Normal breath sounds.  Chest:     Breasts:        Right: No mass, nipple discharge, skin change or tenderness.        Left: No mass, nipple discharge, skin change or tenderness.  Abdominal:     Palpations: Abdomen is soft.     Tenderness: There is no abdominal tenderness. There is no guarding.   Musculoskeletal: Normal range of motion.  Neurological:     Mental Status: She is alert and oriented to person, place, and time.     Cranial Nerves: No cranial nerve deficit.  Psychiatric:        Behavior: Behavior normal.  Vitals signs reviewed.     Results: Results for orders placed or performed in visit on 11/23/18 (from the past 24 hour(s))  POCT Occult Blood Stool     Status: Normal   Collection Time: 11/23/18  3:58 PM  Result Value Ref Range   Fecal Occult Blood, POC Negative Negative   Card #1 Date     Card #2 Fecal Occult Blod, POC     Card #2 Date     Card #3 Fecal Occult Blood, POC     Card #3 Date      Assessment/Plan:  Encounter for annual routine gynecological examination  Cervical cancer screening - Plan: Cytology - PAP  Screening for HPV (human papillomavirus) - Plan: Cytology - PAP  Screening for breast cancer - Pt to sched mammo - Plan: MM 3D SCREEN BREAST BILATERAL  Screening for colon cancer - Neg FOBT. Discussed Cologuard and scr colonoscopy. Pt to f/u prn.  - Plan: POCT Occult Blood Stool  Vasomotor symptoms due to menopause - Rx RF estradiol. F/u pnr.  - Plan: estradiol (ESTRACE) 2 MG tablet   Meds ordered this encounter  Medications  . estradiol (ESTRACE) 2 MG tablet    Sig: Take 1 tablet (2 mg total) by mouth daily.    Dispense:  90 tablet  Refill:  3    Order Specific Question:   Supervising Provider    Answer:   Gae Dry [[id number]]            GYN counsel breast self exam, mammography screening, menopause, adequate intake of calcium and vitamin D, diet and exercise    F/U  Return in about 1 year (around 11/24/2019).  Alicia B. Copland, PA-C 11/23/2018 3:59 PM

## 2018-11-25 LAB — CYTOLOGY - PAP
DIAGNOSIS: NEGATIVE
HPV (WINDOPATH): NOT DETECTED

## 2018-12-24 DIAGNOSIS — R350 Frequency of micturition: Secondary | ICD-10-CM | POA: Diagnosis not present

## 2018-12-24 DIAGNOSIS — R8290 Unspecified abnormal findings in urine: Secondary | ICD-10-CM | POA: Diagnosis not present

## 2019-01-12 DIAGNOSIS — R509 Fever, unspecified: Secondary | ICD-10-CM | POA: Diagnosis not present

## 2019-02-01 DIAGNOSIS — M792 Neuralgia and neuritis, unspecified: Secondary | ICD-10-CM | POA: Diagnosis not present

## 2019-02-01 DIAGNOSIS — G8928 Other chronic postprocedural pain: Secondary | ICD-10-CM | POA: Diagnosis not present

## 2019-02-01 DIAGNOSIS — G894 Chronic pain syndrome: Secondary | ICD-10-CM | POA: Diagnosis not present

## 2019-03-03 ENCOUNTER — Other Ambulatory Visit: Payer: Self-pay | Admitting: Certified Nurse Midwife

## 2019-03-03 DIAGNOSIS — N951 Menopausal and female climacteric states: Secondary | ICD-10-CM

## 2019-03-03 DIAGNOSIS — I10 Essential (primary) hypertension: Secondary | ICD-10-CM | POA: Diagnosis not present

## 2019-03-03 DIAGNOSIS — E7800 Pure hypercholesterolemia, unspecified: Secondary | ICD-10-CM | POA: Diagnosis not present

## 2019-03-03 DIAGNOSIS — Z0000 Encounter for general adult medical examination without abnormal findings: Secondary | ICD-10-CM | POA: Diagnosis not present

## 2019-03-03 DIAGNOSIS — Z79899 Other long term (current) drug therapy: Secondary | ICD-10-CM | POA: Diagnosis not present

## 2019-03-10 ENCOUNTER — Ambulatory Visit: Payer: 59

## 2019-03-29 ENCOUNTER — Ambulatory Visit: Payer: Self-pay | Admitting: Urology

## 2019-04-14 ENCOUNTER — Ambulatory Visit
Admission: RE | Admit: 2019-04-14 | Discharge: 2019-04-14 | Disposition: A | Discharge status: Home / Self Care | Payer: 59 | Source: Ambulatory Visit | Attending: Obstetrics and Gynecology | Admitting: Obstetrics and Gynecology

## 2019-04-14 ENCOUNTER — Other Ambulatory Visit: Payer: Self-pay

## 2019-04-14 ENCOUNTER — Encounter: Payer: Self-pay | Admitting: Obstetrics and Gynecology

## 2019-04-14 DIAGNOSIS — Z1231 Encounter for screening mammogram for malignant neoplasm of breast: Secondary | ICD-10-CM | POA: Diagnosis not present

## 2019-04-14 DIAGNOSIS — Z1239 Encounter for other screening for malignant neoplasm of breast: Secondary | ICD-10-CM

## 2019-04-27 ENCOUNTER — Ambulatory Visit: Payer: Self-pay | Admitting: Urology

## 2019-06-07 ENCOUNTER — Encounter: Payer: Self-pay | Admitting: Urology

## 2019-06-07 ENCOUNTER — Other Ambulatory Visit: Payer: Self-pay

## 2019-06-07 ENCOUNTER — Ambulatory Visit
Admission: RE | Admit: 2019-06-07 | Discharge: 2019-06-07 | Disposition: A | Discharge status: Home / Self Care | Payer: 59 | Source: Ambulatory Visit | Attending: Urology | Admitting: Urology

## 2019-06-07 ENCOUNTER — Ambulatory Visit (INDEPENDENT_AMBULATORY_CARE_PROVIDER_SITE_OTHER): Payer: 59 | Admitting: Urology

## 2019-06-07 VITALS — BP 111/65 | HR 65 | Ht 66.0 in | Wt 111.2 lb

## 2019-06-07 DIAGNOSIS — N200 Calculus of kidney: Secondary | ICD-10-CM

## 2019-06-07 NOTE — Progress Notes (Signed)
06/07/2019 7:06 AM   Patricia Love April 15, 1966 [id number]  Referring provider: Idelle Harb, MD [facility] [place],  [place] [address]  Chief Complaint  Patient presents with  . Nephrolithiasis    HPI: Patricia Love is a 53 y.o. female who presents to establish local urologic care.  She has previously seen Dr. Jacqlyn Larsen in both [place] and at [facility] for nephrolithiasis.  She last saw Dr. Jacqlyn Larsen in May 2019 and KUB showed no calcifications suspicious for stone disease.  A renal ultrasound was ordered for a bump in her creatinine to 1.4.  In the ultrasound showed a 6 mm right lower pole calculus but no hydronephrosis.  Her creatinine is stable.  She is presently asymptomatic and denies flank, abdominal or pelvic pain.  She denies bothersome lower urinary tract symptoms or gross hematuria.  She will occasionally have mild flank or lower abdominal discomfort which she thinks may be secondary to small stones and takes tamsulosin as needed which improves these symptoms.  PMH: Past Medical History:  Diagnosis Date  . Anxiety   . Basal cell carcinoma   . Depression   . Endometriosis   . [facility]ell Patient virus infection   . Hyperlipidemia   . Hypertension   . Kidney stones   . Migraine   . Neuropathy    left breast after reduction mammoplasty, seen at pain clinic    Surgical History: Past Surgical History:  Procedure Laterality Date  . ABDOMINAL HYSTERECTOMY    . BASAL CELL CARCINOMA EXCISION  2014   right wrist  . DIAGNOSTIC LAPAROSCOPY  [id number]   OSIS patent tubes  . ELBOW SURGERY    . kidney stone removal    . LITHOTRIPSY     x4; 05/2016  . REDUCTION MAMMAPLASTY  2012  . reduction mammoplasty  2012   Dr. Tula Nakayama  . TOTAL ABDOMINAL HYSTERECTOMY W/ BILATERAL SALPINGOOPHORECTOMY  2005   adenomyosis and severe endometriosis. Dr Rayford Halsted    Home Medications:  Allergies as of 06/07/2019      Reactions   Contrast Media  [iodinated Diagnostic Agents]  Anaphylaxis   Meloxicam Swelling   Pregabalin Other (See Comments)   Atorvastatin Other (See Comments)   Elevated cholesterol   Butorphanol Tartrate Other (See Comments)   Patient states she is not allergic to this   Chlorphen-diphenhyd-pe-apap    Other reaction(s): Other (See Comments)   Gabapentin    Gemfibrozil Other (See Comments)   Verapamil Other (See Comments)   Codeine Sulfate Nausea Only      Medication List       Accurate as of June 07, 2019 11:59 PM. If you have any questions, ask your nurse or doctor.        STOP taking these medications   terconazole 80 MG vaginal suppository Commonly known as: TERAZOL 3 Stopped by: Abbie Sons, MD     TAKE these medications   ALPRAZolam 0.5 MG tablet Commonly known as: XANAX Take 1 tablet by mouth 3 (three) times daily before meals. What changed: Another medication with the same name was removed. Continue taking this medication, and follow the directions you see here. Changed by: Abbie Sons, MD   amitriptyline 25 MG tablet Commonly known as: ELAVIL Take 1 tablet by mouth daily.   atenolol 50 MG tablet Commonly known as: TENORMIN Take 1 tablet by mouth daily.   buPROPion 150 MG 12 hr tablet Commonly known as: WELLBUTRIN SR Take 150 mg by mouth 2 (  two) times daily.   butalbital-acetaminophen-caffeine 50-325-40 MG tablet Commonly known as: FIORICET Take 2 tablets by mouth every 4 (four) hours as needed.   DULoxetine 30 MG capsule Commonly known as: CYMBALTA Take 1 capsule by mouth daily.   estradiol 2 MG tablet Commonly known as: ESTRACE TAKE 1 TABLET BY MOUTH  EVERY DAY   ezetimibe 10 MG tablet Commonly known as: ZETIA Take 1 tablet by mouth daily.   furosemide 40 MG tablet Commonly known as: LASIX Take 1.5 tablets by mouth daily.   HYDROcodone-acetaminophen 10-325 MG tablet Commonly known as: NORCO Take 1 tablet by mouth every 6 (six) hours as needed.   potassium chloride SA 20 MEQ tablet  Commonly known as: K-DUR Take 4 tablets by mouth every morning.   potassium citrate 10 MEQ (1080 MG) SR tablet Commonly known as: UROCIT-K Take 2 tablets by mouth every morning.   promethazine 25 MG tablet Commonly known as: PHENERGAN Take 1 tablet by mouth every 6 (six) hours as needed.   rosuvastatin 20 MG tablet Commonly known as: CRESTOR Take 1 tablet by mouth daily.   SUMAtriptan 100 MG tablet Commonly known as: IMITREX Take 1 tablet by mouth daily as needed.   tamsulosin 0.4 MG Caps capsule Commonly known as: FLOMAX Take by mouth.   topiramate 25 MG tablet Commonly known as: TOPAMAX Take 3 tablets by mouth 2 (two) times daily.       Allergies:  Allergies  Allergen Reactions  . Contrast Media  [Iodinated Diagnostic Agents] Anaphylaxis  . Meloxicam Swelling  . Pregabalin Other (See Comments)  . Atorvastatin Other (See Comments)    Elevated cholesterol  . Butorphanol Tartrate Other (See Comments)    Patient states she is not allergic to this  . Chlorphen-Diphenhyd-Pe-Apap     Other reaction(s): Other (See Comments)  . Gabapentin   . Gemfibrozil Other (See Comments)  . Verapamil Other (See Comments)  . Codeine Sulfate Nausea Only    Family History: Family History  Problem Relation Age of Onset  . Hyperlipidemia Mother   . Hypertension Mother   . Depression Mother   . Stroke Mother   . Hypertension Father   . Diabetes Father   . Hyperlipidemia Sister   . Hypertension Sister   . Depression Sister   . Ovarian cancer Maternal Grandmother 82  . Breast cancer Neg Hx     Social History:  reports that she has never smoked. She has never used smokeless tobacco. She reports that she does not drink alcohol or use drugs.  ROS: UROLOGY Frequent Urination?: No Hard to postpone urination?: No Burning/pain with urination?: No Get up at night to urinate?: No Leakage of urine?: No Urine stream starts and stops?: No Trouble starting stream?: No Do you have  to strain to urinate?: No Blood in urine?: Yes Urinary tract infection?: No Sexually transmitted disease?: No Injury to kidneys or bladder?: No Painful intercourse?: No Weak stream?: No Currently pregnant?: No Vaginal bleeding?: No Last menstrual period?: N/A  Gastrointestinal Nausea?: No Vomiting?: No Indigestion/heartburn?: No Diarrhea?: No Constipation?: No  Constitutional Fever: No Night sweats?: No Weight loss?: No Fatigue?: No  Skin Skin rash/lesions?: No Itching?: No  Eyes Blurred vision?: No Double vision?: No  Ears/Nose/Throat Sore throat?: No Sinus problems?: No  Hematologic/Lymphatic Swollen glands?: No Easy bruising?: No  Cardiovascular Leg swelling?: No Chest pain?: No  Respiratory Cough?: No Shortness of breath?: No  Endocrine Excessive thirst?: No  Musculoskeletal Back pain?: No Joint pain?: No  Neurological  Headaches?: Yes Dizziness?: No  Psychologic Depression?: No Anxiety?: No  Physical Exam: BP 111/65 (BP Location: Left Arm, Patient Position: Sitting, Cuff Size: Normal)   Pulse 65   Ht 5\' 6"  (1.676 m)   Wt 111 lb 3.2 oz (50.4 kg)   BMI 17.95 kg/m   Constitutional:  Alert and oriented, No acute distress. HEENT: [place] AT, moist mucus membranes.  Trachea midline, no masses. Cardiovascular: No clubbing, cyanosis, or edema. Respiratory: Normal respiratory effort, no increased work of breathing. Neurologic: Grossly intact, no focal deficits, moving all 4 extremities. Psychiatric: Normal mood and affect.   Assessment & Plan:    - Nephrolithiasis Renal ultrasound performed last year showed a 6 mm right lower pole renal calculus.  She is asymptomatic.  KUB was ordered and she will be notified with results.  If stable continue annual follow-up.  She did request a refill of tamsulosin which was sent to her pharmacy.  Abbie Sons, [place] [address], [place], [place] [address]  [phone]

## 2019-06-07 NOTE — Patient Instructions (Signed)
Creatine Information Creatine is a chemical that muscles need in order to tighten (contract) during activity. Creatine helps the body produce a molecule called adenosine triphosphate (ATP). ATP is the main source of energy that muscles need to function. It creates a small amount of energy very quickly without the need for oxygen. In some cases, creatine supplementation may make this process faster and more efficient. Creatine is naturally produced in the body and is found in certain foods, such as fish and red meat. It is also available as a dietary supplement, which is usually in the form of a powder or a sports drink. Why is creatine used in athletics? Creatine supplements may be used to:  Build muscle.  Increase muscle strength.  Increase the rate of recovery from injury.  Improve performance in short-duration, high-intensity exercise.  Increase training intensity.  Improve the quality of workouts. What are the side effects of using creatine? Possible side effects of creatine supplements include:  Muscle cramps.  Muscle tightness.  Weight gain.  Nausea.  Stomach cramps.  Diarrhea.  A decrease in your body's ability to produce creatine naturally after you stop using synthetic creatine supplements. What do I need to know about creatine use?  General information Usually, professional sports associations do not ban the use of creatine supplements. Creatine may help to improve athletic performance in young, healthy people during short periods of rigorous physical activity, such as sprinting or weight lifting. However, creatine supplements do not improve athletic performance in every person who uses them. Some athletes eat carbohydrates, such as bread, pasta, and potatoes (carbohydrate loading) along with creatine supplements to enhance energy levels and performance even more. Warnings Do not take creatine if you have a history of kidney disease or you have conditions such as  diabetes that increase your risk of kidney problems. Do not combine creatine supplements with caffeine or other stimulants or with herbal performance enhancers. Doing this could increase your risk for serious side effects, including stroke. Take these actions to lower your risk of side effects when you use creatine supplements:  Drink enough water to keep your urine pale yellow.  Make sure you get the recommended amount (Recommended Dietary Allowance, or RDA) of potassium. Summary  Creatine is a chemical that muscles need in order to tighten (contract) during activity.  Creatine is naturally produced in the body and is found in certain foods, such as fish and red meat.  Creatine is available as a dietary supplement, which is usually in the form of a powder or a sports drink.  Usually, professional sports associations do not ban the use of creatine supplements. This information is not intended to replace advice given to you by your health care provider. Make sure you discuss any questions you have with your health care provider. Document Released: 10/27/2005 Document Revised: 09/07/2018 Document Reviewed: 09/07/2018 Elsevier Patient Education  2020 [employer].

## 2019-06-09 ENCOUNTER — Telehealth: Payer: Self-pay | Admitting: Urology

## 2019-06-09 MED ORDER — TAMSULOSIN HCL 0.4 MG PO CAPS: 0.4000 mg | ORAL_CAPSULE | Freq: Every day | ORAL | 3 refills | Status: DC | PRN

## 2019-06-09 NOTE — Telephone Encounter (Signed)
Pt calling asking for results of her xray and urine from appt earlier this week.

## 2019-06-09 NOTE — Telephone Encounter (Signed)
Renal ultrasound at [facility] last year was remarkable for a 6 mm right lower pole calculus.  KUB was reviewed and there are small calcifications overlying the right lower pole although I do not see a 6 mm calculus.  There is a possible 4 mm calculus overlying the right upper pole.  Continue annual follow-up and call earlier for development of renal colic.

## 2019-06-09 NOTE — Telephone Encounter (Signed)
Ok to give results?

## 2019-06-09 NOTE — Telephone Encounter (Signed)
Patient notified and voiced understanding. She will call if she is in pain.

## 2019-06-10 ENCOUNTER — Encounter: Payer: Self-pay | Admitting: Urology

## 2019-07-26 ENCOUNTER — Other Ambulatory Visit: Payer: Self-pay | Admitting: Urology

## 2019-07-26 DIAGNOSIS — N200 Calculus of kidney: Secondary | ICD-10-CM

## 2019-07-26 NOTE — Telephone Encounter (Signed)
Pt needs to get a new RX for Potassium Citrate ER tablets sent to [facility].  10 meq 2 x per day.  She forget to get a new RX when she was here to see [facility]io[facility].

## 2019-07-26 NOTE — Telephone Encounter (Signed)
Ok to refill? Does pt need to continue taking?

## 2019-07-27 MED ORDER — POTASSIUM CITRATE ER 10 MEQ (1080 MG) PO TBCR: 20.0000 meq | EXTENDED_RELEASE_TABLET | ORAL | 11 refills | Status: DC

## 2019-09-24 IMAGING — MG DIGITAL SCREENING BILATERAL MAMMOGRAM WITH TOMO AND CAD
6 of 10 series · 6 of 30 positions shown · non-contrast
Comparison: Previous exam(s).

CLINICAL DATA: Screening.

EXAM:
DIGITAL SCREENING BILATERAL MAMMOGRAM WITH TOMO AND CAD

[R CC synth-2D]
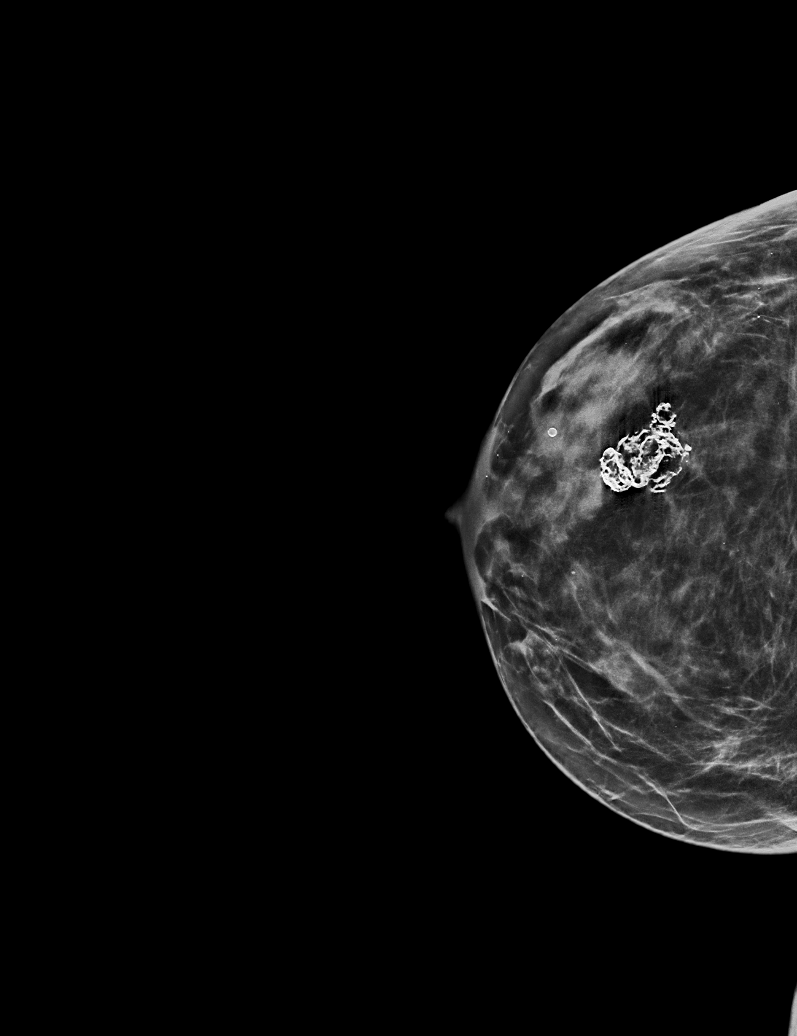

[L CC synth-2D]
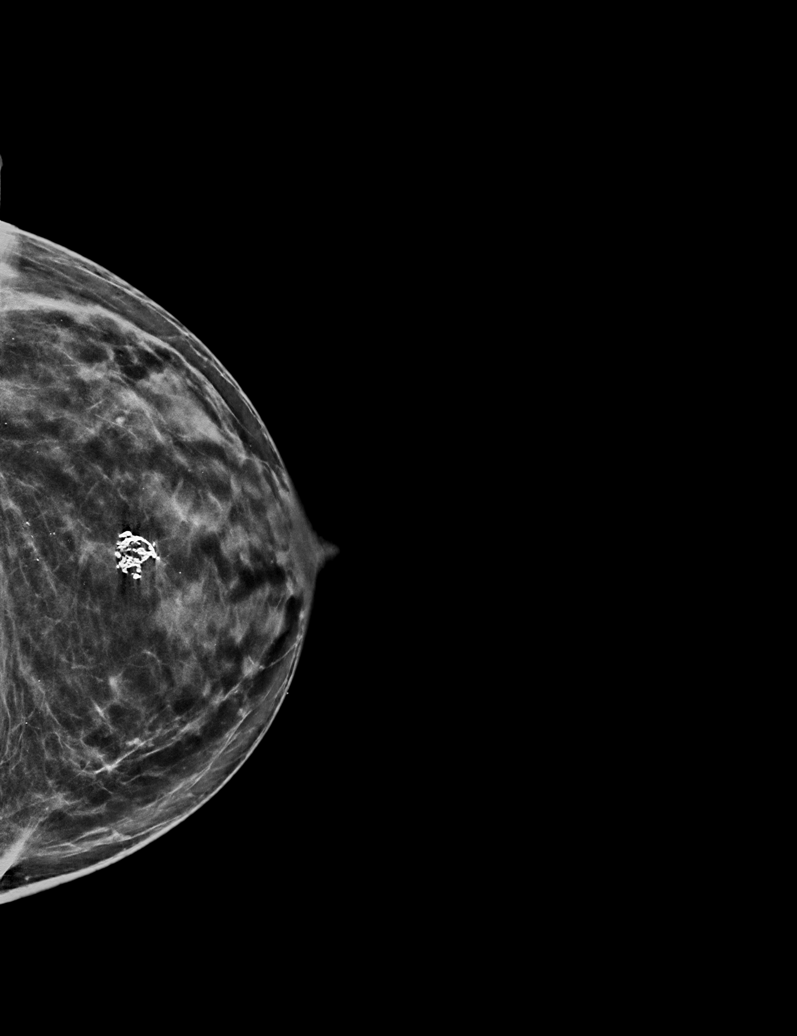

[L XCCL synth-2D]
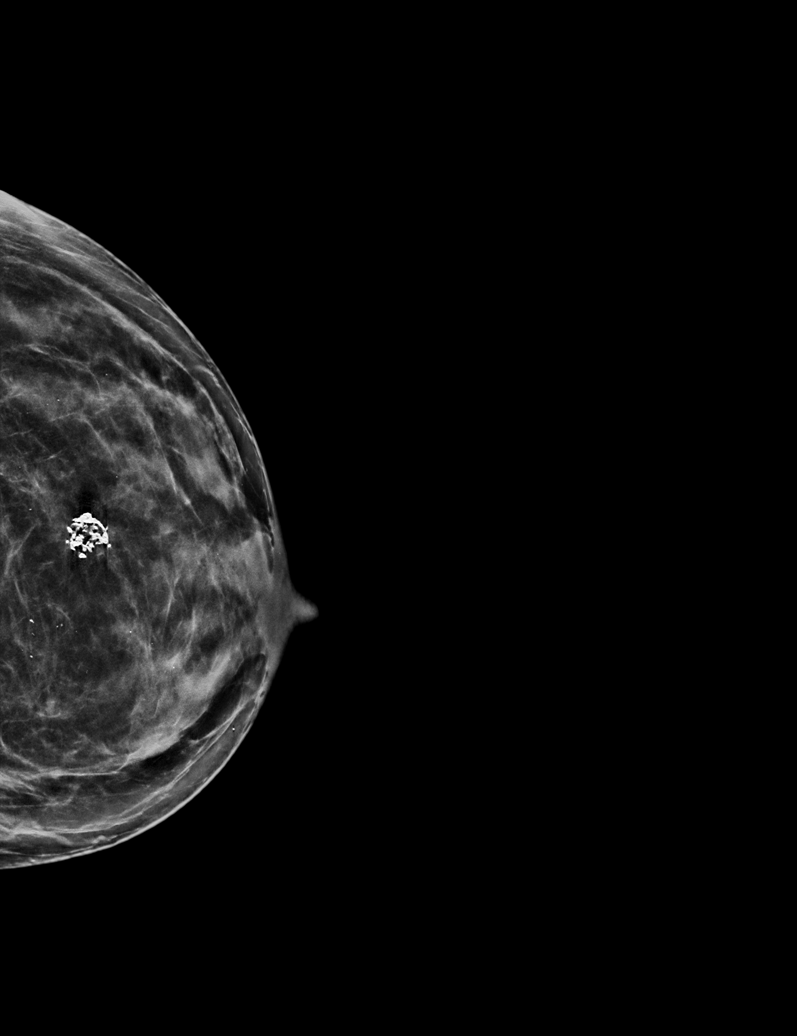

[R MLO synth-2D]
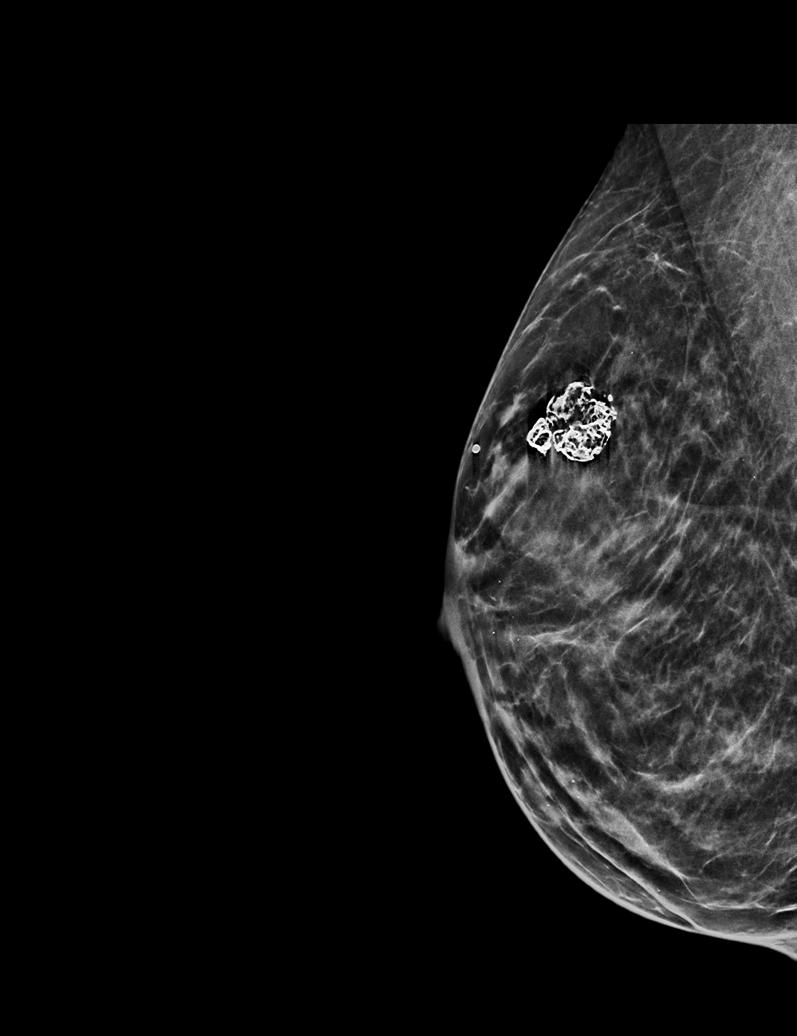

[L MLO synth-2D]
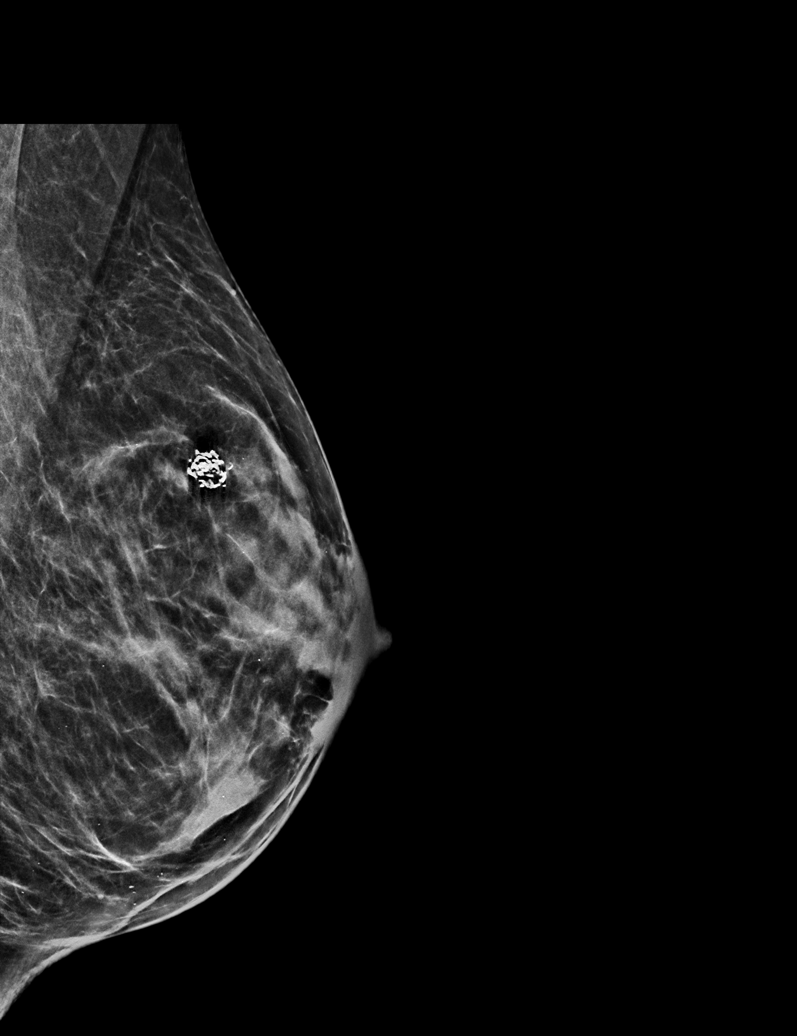

[L XCCL tomo · tomo slice 31/60.0]
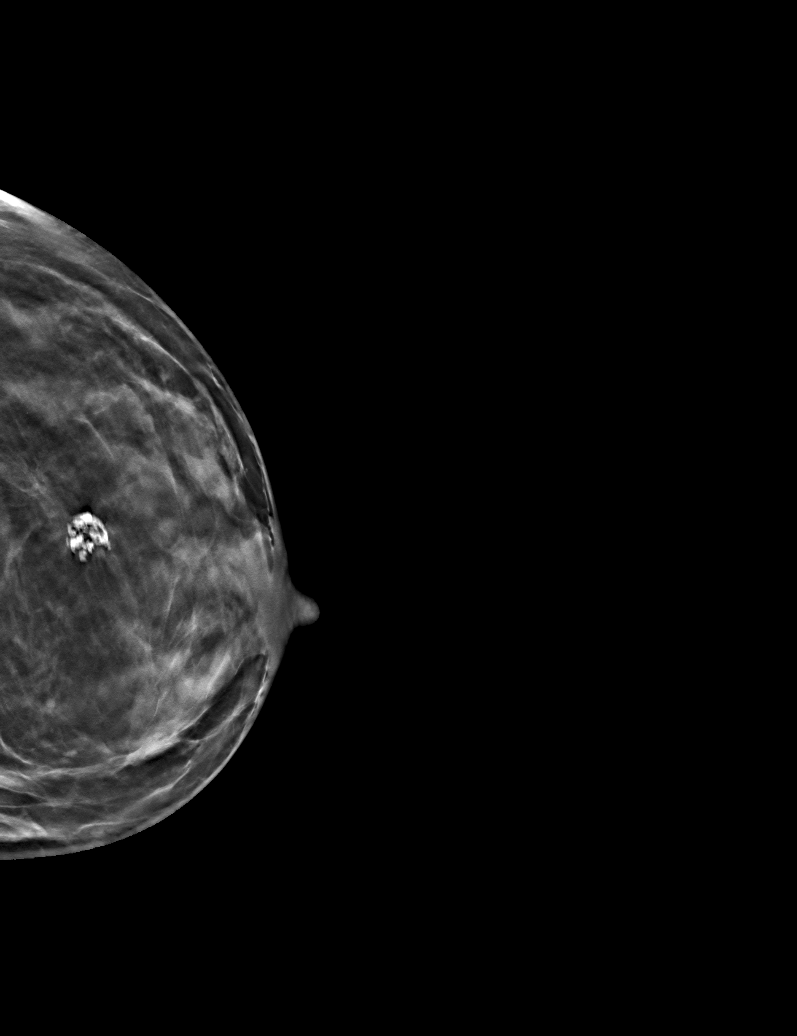

[6 of 30 positions shown; findings below may reference images not displayed]

ACR Breast Density Category c: The breast tissue is heterogeneously
dense, which may obscure small masses.
FINDINGS: There are no findings suspicious for malignancy. Images were
processed with CAD.
IMPRESSION: No mammographic evidence of malignancy. A result letter of this
screening mammogram will be mailed directly to the patient.

RECOMMENDATION:
Screening mammogram in one year. (Code:FT-U-LHB)

BI-RADS CATEGORY  1: Negative.

## 2019-11-07 ENCOUNTER — Other Ambulatory Visit: Payer: Self-pay | Admitting: *Deleted

## 2019-11-07 MED ORDER — TAMSULOSIN HCL 0.4 MG PO CAPS: 0.4000 mg | ORAL_CAPSULE | Freq: Every day | ORAL | 3 refills | Status: DC | PRN

## 2019-11-12 ENCOUNTER — Ambulatory Visit
Admission: EM | Admit: 2019-11-12 | Discharge: 2019-11-12 | Disposition: A | Discharge status: Home / Self Care | Payer: 59 | Attending: Emergency Medicine | Admitting: Emergency Medicine

## 2019-11-12 ENCOUNTER — Other Ambulatory Visit: Payer: Self-pay

## 2019-11-12 ENCOUNTER — Encounter: Payer: Self-pay | Admitting: Emergency Medicine

## 2019-11-12 DIAGNOSIS — Z79899 Other long term (current) drug therapy: Secondary | ICD-10-CM | POA: Diagnosis not present

## 2019-11-12 DIAGNOSIS — R110 Nausea: Secondary | ICD-10-CM | POA: Diagnosis not present

## 2019-11-12 DIAGNOSIS — I10 Essential (primary) hypertension: Secondary | ICD-10-CM | POA: Insufficient documentation

## 2019-11-12 DIAGNOSIS — R05 Cough: Secondary | ICD-10-CM

## 2019-11-12 DIAGNOSIS — Z20822 Contact with and (suspected) exposure to covid-19: Secondary | ICD-10-CM

## 2019-11-12 DIAGNOSIS — R197 Diarrhea, unspecified: Secondary | ICD-10-CM

## 2019-11-12 NOTE — ED Triage Notes (Signed)
Patient c/o cough, nausea, diarrhea, bodyaches, and fever that started Thursday.

## 2019-11-12 NOTE — ED Provider Notes (Signed)
[facility] - [place], [place]   Name: Patricia Love DOB: Nov 09, 1966 MRN: [id number] CSN: [id number] PCP: Idelle Kangas, MD  Arrival date and time:  11/12/19 1158  Chief Complaint:  Generalized Body Aches, Fever, and Cough   NOTE: Prior to seeing the Love today, I have reviewed the triage nursing documentation and vital signs. Clinical staff has updated Love's PMH/PSHx, current medication list, and drug allergies/intolerances to ensure comprehensive history available to assist in medical decision making.   History:   HPI: Patricia Love is a 53 y.o. female who presents today with complaints of cough, nausea, diarrhea, body aches and fever.  She states her symptoms started approximately 2 days ago.  She felt to have had a fever on Thursday night, but she does not have a thermometer at her home to check her temperature.  She states her temperature broke after treating with ibuprofen and Tylenol, but she still has the stomach upset and diarrhea.   She has a possible exposure to COVID-19.  She states a Mudlogger of hers where she was in close contact with about a week ago recently was tested for COVID-19.  She is unsure of his test results at this time.     Past Medical History:  Diagnosis Date  . Anxiety   . Basal cell carcinoma   . Depression   . Endometriosis   . Patricia Love virus infection   . Hyperlipidemia   . Hypertension   . Kidney stones   . Migraine   . Neuropathy    left breast after reduction mammoplasty, seen at pain clinic    Past Surgical History:  Procedure Laterality Date  . ABDOMINAL HYSTERECTOMY    . BASAL CELL CARCINOMA EXCISION  2014   right wrist  . DIAGNOSTIC LAPAROSCOPY  [id number]   OSIS patent tubes  . ELBOW SURGERY    . kidney stone removal    . LITHOTRIPSY     x4; 05/2016  . REDUCTION MAMMAPLASTY  2012  . reduction mammoplasty  2012   Dr. Tula Nakayama  . TOTAL ABDOMINAL HYSTERECTOMY W/ BILATERAL SALPINGOOPHORECTOMY  2005   adenomyosis  and severe endometriosis. Dr Rayford Halsted    Family History  Problem Relation Age of Onset  . Hyperlipidemia Mother   . Hypertension Mother   . Depression Mother   . Stroke Mother   . Hypertension Father   . Diabetes Father   . Hyperlipidemia Sister   . Hypertension Sister   . Depression Sister   . Ovarian cancer Maternal Grandmother 35  . Breast cancer Neg Hx     Social History   Tobacco Use  . Smoking status: Never Smoker  . Smokeless tobacco: Never Used  Substance Use Topics  . Alcohol use: No  . Drug use: No    Love Active Problem List   Diagnosis Date Noted  . Neuropathy   . Migraine   . Kidney stones   . Hypertension   . Hyperlipidemia   . Depression   . Basal cell carcinoma   . Anxiety     Home Medications:    Current Meds  Medication Sig  . ALPRAZolam (XANAX) 0.5 MG tablet Take 1 tablet by mouth 3 (three) times daily before meals.  Marland Kitchen amitriptyline (ELAVIL) 25 MG tablet Take 1 tablet by mouth daily.  Marland Kitchen atenolol (TENORMIN) 50 MG tablet Take 1 tablet by mouth daily.  Marland Kitchen buPROPion (WELLBUTRIN SR) 150 MG 12 hr tablet Take 150 mg by mouth 2 (two)  times daily.  . butalbital-acetaminophen-caffeine (FIORICET, ESGIC) 50-325-40 MG tablet Take 2 tablets by mouth every 4 (four) hours as needed.  . DULoxetine (CYMBALTA) 30 MG capsule Take 1 capsule by mouth daily.  Marland Kitchen estradiol (ESTRACE) 2 MG tablet TAKE 1 TABLET BY MOUTH  EVERY DAY  . ezetimibe (ZETIA) 10 MG tablet Take 1 tablet by mouth daily.  . furosemide (LASIX) 40 MG tablet Take 1.5 tablets by mouth daily.  Marland Kitchen HYDROcodone-acetaminophen (NORCO) 10-325 MG tablet Take 1 tablet by mouth every 6 (six) hours as needed.  . potassium chloride SA (K-DUR,KLOR-CON) 20 MEQ tablet Take 4 tablets by mouth every morning.  . potassium citrate (UROCIT-K) 10 MEQ (1080 MG) SR tablet Take 2 tablets (20 mEq total) by mouth every morning.  . promethazine (PHENERGAN) 25 MG tablet Take 1 tablet by mouth every 6 (six) hours as needed.  .  rosuvastatin (CRESTOR) 20 MG tablet Take 1 tablet by mouth daily.  . SUMAtriptan (IMITREX) 100 MG tablet Take 1 tablet by mouth daily as needed.  . tamsulosin (FLOMAX) 0.4 MG CAPS capsule Take 1 capsule (0.4 mg total) by mouth daily as needed (Stone symptoms).  . topiramate (TOPAMAX) 25 MG tablet Take 3 tablets by mouth 2 (two) times daily.    Allergies:   Contrast media  [iodinated diagnostic agents], Meloxicam, Pregabalin, Atorvastatin, Butorphanol tartrate, Chlorphen-diphenhyd-pe-apap, Gabapentin, Gemfibrozil, Verapamil, and Codeine sulfate  Review of Systems (ROS): Review of Systems  Constitutional: Positive for appetite change, chills and fatigue.  HENT: Positive for postnasal drip and sore throat. Negative for congestion, sinus pain and sneezing.   Respiratory: Positive for cough and shortness of breath. Negative for wheezing.   Cardiovascular: Negative for chest pain.  Gastrointestinal: Positive for diarrhea, nausea and vomiting.  Musculoskeletal: Negative for myalgias.  All other systems reviewed and are negative.    Vital Signs: Today's Vitals   11/12/19 1222 11/12/19 1226 11/12/19 1244  BP:  113/67   Pulse:  66   Resp:  14   Temp:  97.9 F (36.6 C)   TempSrc:  Oral   SpO2:  100%   Weight: 108 lb (49 kg)    Height: 5\' 6"  (1.676 m)    PainSc: 4   4     Physical Exam: Physical Exam Vitals and nursing note reviewed.  Constitutional:      General: She is not in acute distress.    Appearance: She is well-developed. She is ill-appearing.  HENT:     Head: Normocephalic and atraumatic.  Eyes:     Conjunctiva/sclera: Conjunctivae normal.  Cardiovascular:     Rate and Rhythm: Normal rate and regular rhythm.     Heart sounds: No murmur.  Pulmonary:     Effort: Pulmonary effort is normal. No respiratory distress.     Breath sounds: Normal breath sounds.  Abdominal:     Palpations: Abdomen is soft.     Tenderness: There is no abdominal tenderness.  Musculoskeletal:       Cervical back: Neck supple.  Skin:    General: Skin is warm and dry.  Neurological:     Mental Status: She is alert.      Urgent Care Treatments / Results:   LABS: PLEASE NOTE: all labs that were ordered this encounter are listed, however only abnormal results are displayed. Labs Reviewed  NOVEL CORONAVIRUS, NAA (HOSP ORDER, SEND-OUT TO REF LAB; TAT 18-24 HRS)    EKG: -None  RADIOLOGY: No results found.  PROCEDURES: Procedures  MEDICATIONS RECEIVED THIS VISIT:  Medications - No data to display  PERTINENT CLINICAL COURSE NOTES/UPDATES:   Initial Impression / Assessment and Plan / Urgent Care Course:  Pertinent labs & imaging results that were available during my care of the Love were personally reviewed by me and considered in my medical decision making (see lab/imaging section of note for values and interpretations).  Patricia Love is a 53 y.o. female who presents to [facility] today with complaints of cough, nausea, diarrhea, and possible exposure to COVID-19, diagnosed with encounter for COVID-19 testing, and treated as such with the instructions below. NP and Love reviewed discharge instructions below during visit.   Love is well appearing overall in clinic today. She does not appear to be in any acute distress. Presenting symptoms (see HPI) and exam as documented above.   I have reviewed the follow up and strict return precautions for any new or worsening symptoms. Love is aware of symptoms that would be deemed urgent/emergent, and would thus require further evaluation either here or in the emergency department. At the time of discharge, she verbalized understanding and consent with the discharge plan as it was reviewed with her. All questions were fielded by provider and/or clinic staff prior to Love discharge.    Final Clinical Impressions / Urgent Care Diagnoses:   Final diagnoses:  Encounter for laboratory testing for COVID-19 virus     New Prescriptions:  [place] Controlled Substance Registry consulted? Not Applicable  No orders of the defined types were placed in this encounter.     Discharge Instructions     You are seen for Covid testing.  You are to stay home and isolated until you are notified of a negative result.  Treat your symptoms with over-the-counter medications such as Tylenol (no more than 2 g a day) and ibuprofen.  Take your antinausea medication as prescribed.  If your symptoms worsen, or you are not able to keep anything down, seek further evaluation.    Recommended Follow up Care:  Love encouraged to follow up with the following provider within the specified time frame, or sooner as dictated by the severity of her symptoms. As always, she was instructed that for any urgent/emergent care needs, she should seek care either here or in the emergency department for more immediate evaluation.   Gertie Baron, DNP, NP-c    Gertie Baron, NP 11/12/19 [phone]

## 2019-11-12 NOTE — Discharge Instructions (Addendum)
You are seen for Covid testing.  You are to stay home and isolated until you are notified of a negative result.  Treat your symptoms with over-the-counter medications such as Tylenol (no more than 2 g a day) and ibuprofen.  Take your antinausea medication as prescribed.  If your symptoms worsen, or you are not able to keep anything down, seek further evaluation.

## 2019-11-13 LAB — NOVEL CORONAVIRUS, NAA (HOSP ORDER, SEND-OUT TO REF LAB; TAT 18-24 HRS): SARS-CoV-2, NAA: NOT DETECTED

## 2019-11-17 IMAGING — CR ABDOMEN - 1 VIEW
1 series · 2 of 2 positions shown · non-contrast
Comparison: Radiographs 12/02/2013.

CLINICAL DATA: Nephrolithiasis. History of right-sided kidney
stone. No current complaints.

EXAM:
ABDOMEN - 1 VIEW

[Series 1: t abdomen supine · 0.14mm/px · 2 of 2 slices shown]
[im 1/2]
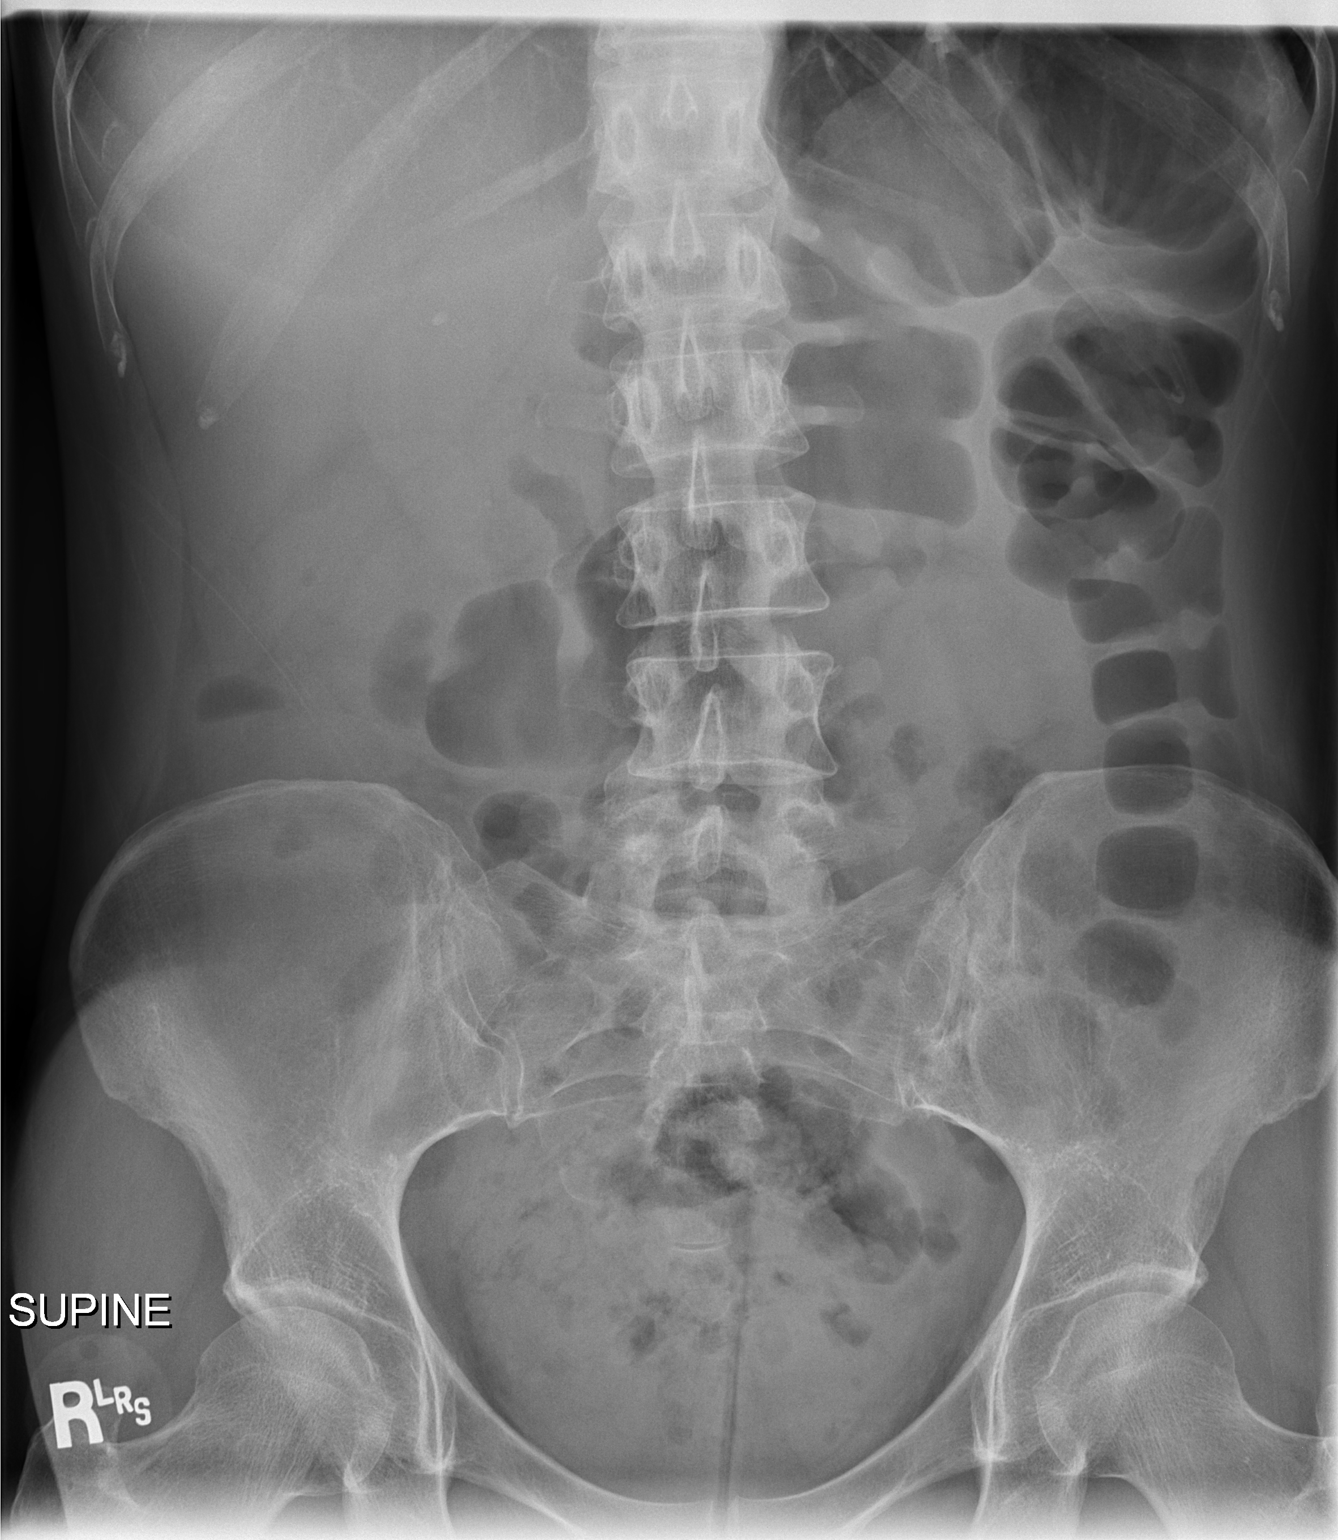
[im 2/2]
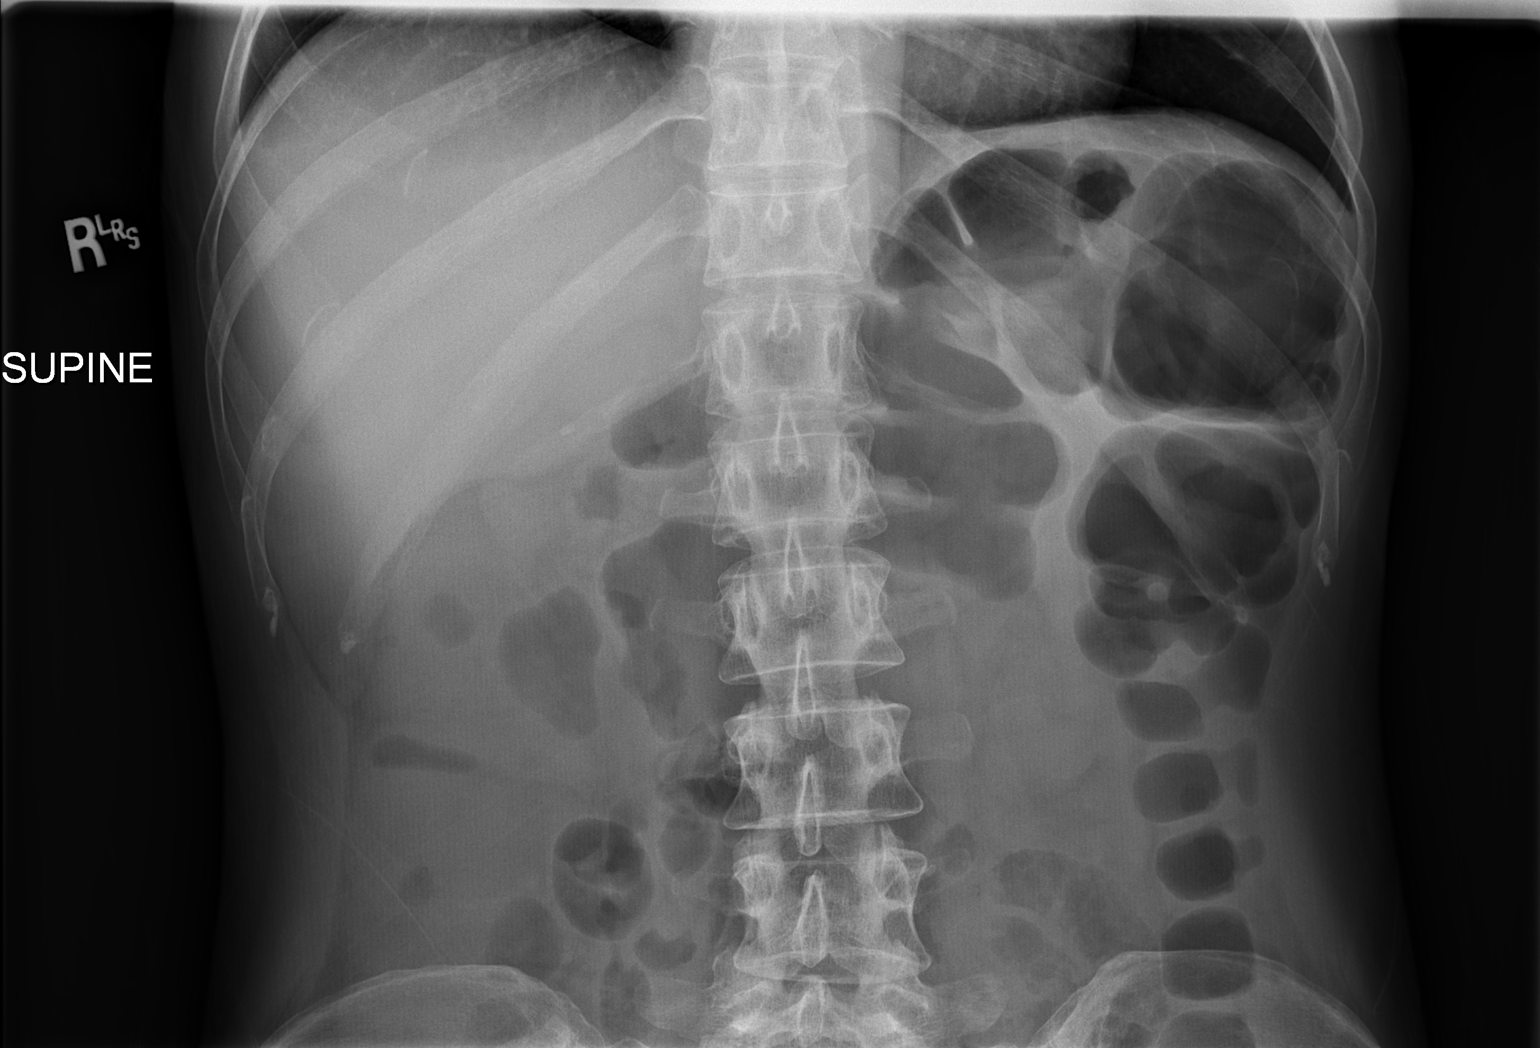

[2 of 2 positions shown; findings below may reference images not displayed]

FINDINGS: Two supine views of the abdomen demonstrate a 4 mm calcification
projecting over the upper pole of the right kidney. There is a tiny
calcification projecting over the lower pole. No new calcifications
are seen along the expected course of the ureters. The bowel gas
pattern is normal. The bones appear unchanged.
IMPRESSION: Possible tiny right-sided renal calculi. No radiographic evidence of
ureteral calculus or acute process.

## 2020-01-19 ENCOUNTER — Other Ambulatory Visit: Payer: Self-pay | Admitting: Certified Nurse Midwife

## 2020-01-19 DIAGNOSIS — N951 Menopausal and female climacteric states: Secondary | ICD-10-CM

## 2020-01-23 ENCOUNTER — Other Ambulatory Visit: Payer: Self-pay

## 2020-01-23 ENCOUNTER — Ambulatory Visit (INDEPENDENT_AMBULATORY_CARE_PROVIDER_SITE_OTHER): Payer: 59 | Admitting: Certified Nurse Midwife

## 2020-01-23 ENCOUNTER — Encounter: Payer: Self-pay | Admitting: Certified Nurse Midwife

## 2020-01-23 VITALS — BP 100/80 | HR 60 | Temp 97.6°F | Ht 65.0 in | Wt 112.0 lb

## 2020-01-23 DIAGNOSIS — L292 Pruritus vulvae: Secondary | ICD-10-CM | POA: Diagnosis not present

## 2020-01-23 DIAGNOSIS — Z90710 Acquired absence of both cervix and uterus: Secondary | ICD-10-CM

## 2020-01-23 DIAGNOSIS — N898 Other specified noninflammatory disorders of vagina: Secondary | ICD-10-CM

## 2020-01-23 DIAGNOSIS — R300 Dysuria: Secondary | ICD-10-CM

## 2020-01-23 LAB — POCT URINALYSIS DIPSTICK
Bilirubin, UA: NEGATIVE
Blood, UA: NEGATIVE
Glucose, UA: NEGATIVE
Ketones, UA: NEGATIVE
Nitrite, UA: NEGATIVE
Protein, UA: POSITIVE — AB
Spec Grav, UA: 1.02 (ref 1.010–1.025)
Urobilinogen, UA: NEGATIVE E.U./dL — AB
pH, UA: 6 (ref 5.0–8.0)

## 2020-01-23 MED ORDER — NITROFURANTOIN MONOHYD MACRO 100 MG PO CAPS: 100.0000 mg | ORAL_CAPSULE | Freq: Two times a day (BID) | ORAL | 0 refills | Status: DC

## 2020-01-23 MED ORDER — CLOTRIMAZOLE-BETAMETHASONE 1-0.05 % EX CREA: 1.0000 "application " | TOPICAL_CREAM | Freq: Two times a day (BID) | CUTANEOUS | 0 refills | Status: DC | PRN

## 2020-01-23 MED ORDER — FLUCONAZOLE 150 MG PO TABS: ORAL_TABLET | ORAL | 0 refills | Status: DC

## 2020-01-23 NOTE — Progress Notes (Signed)
Obstetrics & Gynecology Office Visit   Chief Complaint:  Chief Complaint  Patient presents with  . Urinary Tract Infection    burning, bleeding (unknown origin); thick d/c    History of Present Illness: [name] is a 54 y.o. G1P1001 with history of TAHBSO for endometriosis in 2005.  She takes estradiol 2 mg daily. She presents today for an acute appointment with complaints of dysuria and bleeding when she wiped x 4-5 days. She has a history of renal stones and she initially thought she was trying to pass a stone. She then noticed a white vaginal discharge and some vulvar itching. Her burning with urination was both in the urethra and of the skin when the urine hits the skin.She has not been on any antibiotics recently. Denies fever.   Past medical and surgical histories reviewed. Review of Systems:  Review of Systems  Constitutional: Negative for chills, fever and weight loss.  HENT: Negative for congestion, sinus pain and sore throat.   Eyes: Negative for blurred vision and pain.  Respiratory: Negative for hemoptysis, shortness of breath and wheezing.   Cardiovascular: Negative for chest pain, palpitations and leg swelling.  Gastrointestinal: Negative for abdominal pain, blood in stool, diarrhea, heartburn, nausea and vomiting.  Genitourinary: Positive for dysuria. Negative for frequency, hematuria and urgency.       Positive for vaginal discharge and vulvar itching  Musculoskeletal: Negative for back pain, joint pain and myalgias.  Skin: Negative for itching and rash.  Neurological: Negative for dizziness, tingling and headaches.  Endo/Heme/Allergies: Negative for environmental allergies and polydipsia. Does not bruise/bleed easily.       Negative for hirsutism   Psychiatric/Behavioral: Negative for depression. The patient is not nervous/anxious and does not have insomnia.      Past Medical History:  Past Medical History:  Diagnosis Date  . Anxiety   . Basal cell  carcinoma   . Depression   . Endometriosis   . Randell Patient virus infection   . Hyperlipidemia   . Hypertension   . Kidney stones   . Migraine   . Neuropathy    left breast after reduction mammoplasty, seen at pain clinic    Past Surgical History:  Past Surgical History:  Procedure Laterality Date  . ABDOMINAL HYSTERECTOMY    . BASAL CELL CARCINOMA EXCISION  2014   right wrist  . DIAGNOSTIC LAPAROSCOPY  [id number]   OSIS patent tubes  . ELBOW SURGERY    . kidney stone removal    . LITHOTRIPSY     x4; 05/2016  . REDUCTION MAMMAPLASTY  2012  . reduction mammoplasty  2012   Dr. Tula Nakayama  . TOTAL ABDOMINAL HYSTERECTOMY W/ BILATERAL SALPINGOOPHORECTOMY  2005   adenomyosis and severe endometriosis. Dr Rayford Halsted    Gynecologic History: No LMP recorded (lmp unknown). Patient has had a hysterectomy.  Obstetric History: G1P1001  Family History:  Family History  Problem Relation Age of Onset  . Hyperlipidemia Mother   . Hypertension Mother   . Depression Mother   . Stroke Mother   . Hypertension Father   . Diabetes Father   . Hyperlipidemia Sister   . Hypertension Sister   . Depression Sister   . Ovarian cancer Maternal Grandmother 13  . Breast cancer Neg Hx     Social History:  Social History   Socioeconomic History  . Marital status: Divorced    Spouse name: Not on file  . Number of children: 1  . Years of  education: 16  . Highest education level: Not on file  Occupational History  . Occupation: Location manager  Tobacco Use  . Smoking status: Never Smoker  . Smokeless tobacco: Never Used  Substance and Sexual Activity  . Alcohol use: No  . Drug use: No  . Sexual activity: Yes    Birth control/protection: Surgical    Comment: Hysterectomy  Other Topics Concern  . Not on file  Social History Narrative  . Not on file   Social Determinants of Health   Financial Resource Strain:   . Difficulty of Paying Living Expenses:   Food Insecurity:   . Worried About  Charity fundraiser in the Last Year:   . Arboriculturist in the Last Year:   Transportation Needs:   . Film/video editor (Medical):   Marland Kitchen Lack of Transportation (Non-Medical):   Physical Activity:   . Days of Exercise per Week:   . Minutes of Exercise per Session:   Stress:   . Feeling of Stress :   Social Connections:   . Frequency of Communication with Friends and Family:   . Frequency of Social Gatherings with Friends and Family:   . Attends Religious Services:   . Active Member of Clubs or Organizations:   . Attends Archivist Meetings:   Marland Kitchen Marital Status:   Intimate Partner Violence:   . Fear of Current or Ex-Partner:   . Emotionally Abused:   Marland Kitchen Physically Abused:   . Sexually Abused:     Allergies:  Allergies  Allergen Reactions  . Contrast Media  [Iodinated Diagnostic Agents] Anaphylaxis  . Meloxicam Swelling  . Pregabalin Other (See Comments)  . Atorvastatin Other (See Comments)    Elevated cholesterol  . Butorphanol Tartrate Other (See Comments)    Patient states she is not allergic to this  . Chlorphen-Diphenhyd-Pe-Apap     Other reaction(s): Other (See Comments)  . Gabapentin   . Gemfibrozil Other (See Comments)  . Verapamil Other (See Comments)  . Codeine Sulfate Nausea Only    Medications: Prior to Admission medications   Medication Sig Start Date End Date Taking? Authorizing Provider  ALPRAZolam Duanne Moron) 1 MG tablet Take 1 mg by mouth as directed. 12/01/19  Yes [provider]  amitriptyline (ELAVIL) 25 MG tablet Take 1 tablet by mouth daily. 03/23/17  Yes [provider]  atenolol (TENORMIN) 50 MG tablet Take 1 tablet by mouth daily. 12/19/15  Yes [provider]  buPROPion (WELLBUTRIN SR) 150 MG 12 hr tablet Take 150 mg by mouth 2 (two) times daily. 02/11/16  Yes [provider]  butalbital-acetaminophen-caffeine (FIORICET, ESGIC) 50-325-40 MG tablet Take 2 tablets by mouth every 4 (four) hours as needed.  04/09/17  Yes [provider]  DULoxetine (CYMBALTA) 30 MG capsule Take 1 capsule by mouth daily. 12/19/15  Yes [provider]  estradiol (ESTRACE) 2 MG tablet TAKE 1 TABLET BY MOUTH  EVERY DAY 03/03/19  Yes Dalia Heading, CNM  ezetimibe (ZETIA) 10 MG tablet Take 1 tablet by mouth daily. 05/17/17  Yes [provider]  furosemide (LASIX) 40 MG tablet Take 1.5 tablets by mouth daily. 02/09/17  Yes [provider]  HYDROcodone-acetaminophen (NORCO) 10-325 MG tablet Take 1 tablet by mouth every 6 (six) hours as needed. 05/07/16  Yes [provider]  potassium chloride SA (K-DUR,KLOR-CON) 20 MEQ tablet Take 4 tablets by mouth every morning. 02/09/17  Yes [provider]  potassium citrate (UROCIT-K) 10 MEQ (1080 MG)  SR tablet Take 2 tablets (20 mEq total) by mouth every morning. 07/27/19  Yes Stoioff, Ronda Fairly, MD  promethazine (PHENERGAN) 25 MG tablet Take 1 tablet by mouth every 6 (six) hours as needed. 08/05/16  Yes [provider]  rosuvastatin (CRESTOR) 20 MG tablet Take 1 tablet by mouth daily. 02/09/17  Yes [provider]  SUMAtriptan (IMITREX) 100 MG tablet Take 1 tablet by mouth daily as needed. 12/24/15  Yes [provider]  tamsulosin (FLOMAX) 0.4 MG CAPS capsule Take 1 capsule (0.4 mg total) by mouth daily as needed (Stone symptoms). 11/07/19  Yes Stoioff, Ronda Fairly, MD  topiramate (TOPAMAX) 25 MG tablet Take 3 tablets by mouth 2 (two) times daily. 02/09/17  Yes [provider]  clotrimazole-betamethasone (LOTRISONE) cream Apply 1 application topically 2 (two) times daily as needed. 01/23/20   Dalia Heading, CNM  fluconazole (DIFLUCAN) 150 MG tablet Take one tablet every 3 days x 2 doses 01/23/20   Dalia Heading, CNM  nitrofurantoin, macrocrystal-monohydrate, (MACROBID) 100 MG capsule Take 1 capsule (100 mg total) by mouth 2 (two) times daily. 01/23/20   Dalia Heading, CNM    Physical Exam Vitals: BP  100/80   Pulse 60   Temp 97.6 F (36.4 C)   Ht 5\' 5"  (1.651 m)   Wt 112 lb (50.8 kg)   LMP  (LMP Unknown)   BMI 18.64 kg/m    Physical Exam  Constitutional: She is oriented to person, place, and time. She appears well-developed and well-nourished. No distress.  GI: There is no CVA tenderness.  Genitourinary:    Genitourinary Comments: Vulva: inflamed introitus Vagina: white vaginal discharge Cervix: surgically absent   Neurological: She is alert and oriented to person, place, and time.  Skin: Skin is warm and dry.  Psychiatric: She has a normal mood and affect.   Urinalysis    Component Value Date/Time   BILIRUBINUR neg 01/23/2020 1157   PROTEINUR Positive (A) 01/23/2020 1157   UROBILINOGEN negative (A) 01/23/2020 1157   NITRITE neg 01/23/2020 1157   LEUKOCYTESUR Trace (A) 01/23/2020 1157   Wet prep: positive for hyphae and negative for clue cells and Trichimonas   Assessment: 54 y.o. G1P1001 with probable UTI  Monilial vulvovaginitis  Plan: Start Macrobid BID while awaiting urine culture results Diflucan 150 mgm every 3 days x 2 doses Lotrisone -apply externally BID Increase water intake Will call with lab results.  Dalia Heading, CNM

## 2020-01-25 LAB — URINE CULTURE

## 2020-01-27 NOTE — Telephone Encounter (Signed)
TAlked with patient regarding her continued symptoms which seem to be both related to UTI and to her monilial infection. Did not get the Lotrisone that was prescribed. Called her pharmacy and asked about the Lotrisone and I also ordered Terazol suppositories. Will stop the MAcrobid and start Ceftin 250 mgm BID x 5 days. Dalia Heading, CNM

## 2020-02-01 ENCOUNTER — Other Ambulatory Visit: Payer: Self-pay | Admitting: Certified Nurse Midwife

## 2020-02-01 DIAGNOSIS — N951 Menopausal and female climacteric states: Secondary | ICD-10-CM

## 2020-02-06 LAB — POCT WET PREP (WET MOUNT): Trichomonas Wet Prep HPF POC: ABSENT

## 2020-03-19 ENCOUNTER — Other Ambulatory Visit: Payer: Self-pay | Admitting: Certified Nurse Midwife

## 2020-03-19 ENCOUNTER — Other Ambulatory Visit: Payer: Self-pay | Admitting: Obstetrics and Gynecology

## 2020-03-19 DIAGNOSIS — Z1231 Encounter for screening mammogram for malignant neoplasm of breast: Secondary | ICD-10-CM

## 2020-03-27 ENCOUNTER — Other Ambulatory Visit: Payer: Self-pay | Admitting: *Deleted

## 2020-03-27 MED ORDER — TAMSULOSIN HCL 0.4 MG PO CAPS: 0.4000 mg | ORAL_CAPSULE | Freq: Every day | ORAL | 3 refills | Status: DC | PRN

## 2020-03-28 ENCOUNTER — Other Ambulatory Visit: Payer: Self-pay | Admitting: Certified Nurse Midwife

## 2020-03-28 ENCOUNTER — Telehealth: Payer: Self-pay | Admitting: Obstetrics and Gynecology

## 2020-03-28 DIAGNOSIS — N951 Menopausal and female climacteric states: Secondary | ICD-10-CM

## 2020-03-28 MED ORDER — ESTRADIOL 2 MG PO TABS: 2.0000 mg | ORAL_TABLET | Freq: Every day | ORAL | 0 refills | Status: DC

## 2020-03-28 NOTE — Telephone Encounter (Signed)
Refill sent to [facility].

## 2020-03-28 NOTE — Telephone Encounter (Signed)
Please advise 

## 2020-03-28 NOTE — Telephone Encounter (Signed)
Patient is schedule for annual on 04/05/20. Patient is needing an refill on her Estradial prescription to be sent to [facility]. Please advise

## 2020-03-29 NOTE — Telephone Encounter (Signed)
Pt aware.

## 2020-04-05 ENCOUNTER — Encounter: Payer: Self-pay | Admitting: Obstetrics and Gynecology

## 2020-04-05 ENCOUNTER — Ambulatory Visit (INDEPENDENT_AMBULATORY_CARE_PROVIDER_SITE_OTHER): Payer: 59 | Admitting: Obstetrics and Gynecology

## 2020-04-05 ENCOUNTER — Other Ambulatory Visit: Payer: Self-pay

## 2020-04-05 VITALS — BP 90/60 | Ht 65.0 in | Wt 109.0 lb

## 2020-04-05 DIAGNOSIS — Z1231 Encounter for screening mammogram for malignant neoplasm of breast: Secondary | ICD-10-CM

## 2020-04-05 DIAGNOSIS — Z01419 Encounter for gynecological examination (general) (routine) without abnormal findings: Secondary | ICD-10-CM | POA: Diagnosis not present

## 2020-04-05 DIAGNOSIS — R399 Unspecified symptoms and signs involving the genitourinary system: Secondary | ICD-10-CM | POA: Diagnosis not present

## 2020-04-05 DIAGNOSIS — Z1211 Encounter for screening for malignant neoplasm of colon: Secondary | ICD-10-CM

## 2020-04-05 DIAGNOSIS — B9689 Other specified bacterial agents as the cause of diseases classified elsewhere: Secondary | ICD-10-CM | POA: Diagnosis not present

## 2020-04-05 DIAGNOSIS — N951 Menopausal and female climacteric states: Secondary | ICD-10-CM

## 2020-04-05 DIAGNOSIS — N760 Acute vaginitis: Secondary | ICD-10-CM | POA: Diagnosis not present

## 2020-04-05 LAB — POCT URINALYSIS DIPSTICK
Bilirubin, UA: NEGATIVE
Blood, UA: NEGATIVE
Glucose, UA: NEGATIVE
Ketones, UA: NEGATIVE
Nitrite, UA: NEGATIVE
Protein, UA: NEGATIVE
Spec Grav, UA: 1.02 (ref 1.010–1.025)
pH, UA: 5 (ref 5.0–8.0)

## 2020-04-05 LAB — HEMOCCULT GUIAC POC 1CARD (OFFICE): Fecal Occult Blood, POC: NEGATIVE

## 2020-04-05 LAB — POCT WET PREP WITH KOH
Clue Cells Wet Prep HPF POC: POSITIVE
KOH Prep POC: POSITIVE — AB
Trichomonas, UA: NEGATIVE
Yeast Wet Prep HPF POC: NEGATIVE

## 2020-04-05 MED ORDER — ESTRADIOL 2 MG PO TABS: 2.0000 mg | ORAL_TABLET | Freq: Every day | ORAL | 3 refills | Status: DC

## 2020-04-05 MED ORDER — METRONIDAZOLE 500 MG PO TABS: 500.0000 mg | ORAL_TABLET | Freq: Two times a day (BID) | ORAL | 0 refills | Status: DC

## 2020-04-05 NOTE — Patient Instructions (Signed)
I value your feedback and entrusting us with your care. If you get a [facility] patient survey, I would appreciate you taking the time to let us know about your experience today. Thank you!  As of October 20, 2019, your lab results will be released to your MyChart immediately, before I even have a chance to see them. Please give me time to review them and contact you if there are any abnormalities. Thank you for your patience.  

## 2020-04-05 NOTE — Progress Notes (Signed)
PCP: Idelle Stoffel, MD   Chief Complaint  Patient presents with  . Gynecologic Exam  . Vaginal Discharge    odor, itchiness and irritation since last week  . Urinary Tract Infection    burning urinating, blood in urine    HPI:      [name] is a 54 y.o. G1P1001 who LMP was No LMP recorded (lmp unknown). Patient has had a hysterectomy., presents today for her annual examination.  Her menses are absent due to [facility] for endometriosis in 2005. She does not have PMB..  She does not have vasomotor sx if takes estradiol 2 mg daily. Not ready to wean down.  Sex activity: currently sexually active. Denies vag dryness.   Has noticed hematuria and colicky RLQ pain recently. Hx of kidney stones and feels like she is passing them. No frequency/urgency, fevers. Has seen urology. In past for lithotripsy x 4. Had E coli on C&S 3/21, treated with macrobid. Pt also with vaginal itching/irritation/d/c, with dysuria and odor for the past wk. No meds to treat. Has tried lotrisone crm without relief.    Last Pap: 11/23/18 Results: no abnormalities/neg HPV DNA Hx of STDs: none  Last mammogram: 04/14/19  Results were: normal--routine follow-up in 12 months; has appt 04/23/20 There is no FH of breast cancer. There is no FH of ovarian cancer. The patient does not do self-breast exams. S/p breast reduction.    Colonoscopy: never; pt's PCP wants to delay any more stress to pt's body for now. Neg FOBT yearly.   Tobacco use: The patient denies current or previous tobacco use. Alcohol use: none  No drug use Exercise: moderately active  Coloscopy: never; doing cologuard with PCP but wants FOBT today.  She does get adequate calcium and Vitamin D in her diet.  Labs with PCP.   Past Medical History:  Diagnosis Date  . Anxiety   . Basal cell carcinoma   . Depression   . Endometriosis   . Randell Patient virus infection   . Hyperlipidemia   . Hypertension   . Kidney stones   . Migraine   .  Neuropathy    left breast after reduction mammoplasty, seen at pain clinic    Past Surgical History:  Procedure Laterality Date  . ABDOMINAL HYSTERECTOMY    . BASAL CELL CARCINOMA EXCISION  2014   right wrist  . DIAGNOSTIC LAPAROSCOPY  [id number]   OSIS patent tubes  . ELBOW SURGERY    . kidney stone removal    . LITHOTRIPSY     x4; 05/2016  . REDUCTION MAMMAPLASTY  2012  . reduction mammoplasty  2012   Dr. Tula Nakayama  . TOTAL ABDOMINAL HYSTERECTOMY W/ BILATERAL SALPINGOOPHORECTOMY  2005   adenomyosis and severe endometriosis. Dr Rayford Halsted    Family History  Problem Relation Age of Onset  . Hyperlipidemia Mother   . Hypertension Mother   . Depression Mother   . Stroke Mother   . Hypertension Father   . Diabetes Father   . Hyperlipidemia Sister   . Hypertension Sister   . Depression Sister   . Ovarian cancer Maternal Grandmother 72  . Breast cancer Neg Hx     Social History   Socioeconomic History  . Marital status: Divorced    Spouse name: Not on file  . Number of children: 1  . Years of education: 89  . Highest education level: Not on file  Occupational History  . Occupation: Location manager  Tobacco Use  .  Smoking status: Never Smoker  . Smokeless tobacco: Never Used  Substance and Sexual Activity  . Alcohol use: No  . Drug use: No  . Sexual activity: Yes    Birth control/protection: Surgical    Comment: Hysterectomy  Other Topics Concern  . Not on file  Social History Narrative  . Not on file   Social Determinants of Health   Financial Resource Strain:   . Difficulty of Paying Living Expenses:   Food Insecurity:   . Worried About Charity fundraiser in the Last Year:   . Arboriculturist in the Last Year:   Transportation Needs:   . Film/video editor (Medical):   Marland Kitchen Lack of Transportation (Non-Medical):   Physical Activity:   . Days of Exercise per Week:   . Minutes of Exercise per Session:   Stress:   . Feeling of Stress :   Social  Connections:   . Frequency of Communication with Friends and Family:   . Frequency of Social Gatherings with Friends and Family:   . Attends Religious Services:   . Active Member of Clubs or Organizations:   . Attends Archivist Meetings:   Marland Kitchen Marital Status:   Intimate Partner Violence:   . Fear of Current or Ex-Partner:   . Emotionally Abused:   Marland Kitchen Physically Abused:   . Sexually Abused:     Outpatient Medications Prior to Visit  Medication Sig Dispense Refill  . ALPRAZolam (XANAX) 0.5 MG tablet Take 0.5 mg by mouth 2 (two) times daily as needed.    Marland Kitchen amitriptyline (ELAVIL) 25 MG tablet Take 1 tablet by mouth daily.    Marland Kitchen atenolol (TENORMIN) 50 MG tablet Take 1 tablet by mouth daily.    Marland Kitchen buPROPion (WELLBUTRIN SR) 150 MG 12 hr tablet Take 150 mg by mouth 2 (two) times daily.    . butalbital-acetaminophen-caffeine (FIORICET, ESGIC) 50-325-40 MG tablet Take 2 tablets by mouth every 4 (four) hours as needed.    . clotrimazole-betamethasone (LOTRISONE) cream Apply 1 application topically 2 (two) times daily as needed. 30 g 0  . DULoxetine (CYMBALTA) 30 MG capsule Take 1 capsule by mouth daily.    Marland Kitchen ezetimibe (ZETIA) 10 MG tablet Take 1 tablet by mouth daily.    . furosemide (LASIX) 40 MG tablet Take 1.5 tablets by mouth daily.    Marland Kitchen HYDROcodone-acetaminophen (NORCO) 10-325 MG tablet Take 1 tablet by mouth every 6 (six) hours as needed.    . potassium chloride SA (K-DUR,KLOR-CON) 20 MEQ tablet Take 4 tablets by mouth every morning.    . potassium citrate (UROCIT-K) 10 MEQ (1080 MG) SR tablet Take 2 tablets (20 mEq total) by mouth every morning. 30 tablet 11  . promethazine (PHENERGAN) 25 MG tablet Take 1 tablet by mouth every 6 (six) hours as needed.    . rosuvastatin (CRESTOR) 20 MG tablet Take 1 tablet by mouth daily.    . SUMAtriptan (IMITREX) 100 MG tablet Take 1 tablet by mouth daily as needed.    . topiramate (TOPAMAX) 25 MG tablet Take 3 tablets by mouth 2 (two) times daily.     Marland Kitchen estradiol (ESTRACE) 2 MG tablet Take 1 tablet (2 mg total) by mouth daily. 90 tablet 0  . ALPRAZolam (XANAX) 1 MG tablet Take 1 mg by mouth as directed.    . fluconazole (DIFLUCAN) 150 MG tablet Take one tablet every 3 days x 2 doses 2 tablet 0  . nitrofurantoin, macrocrystal-monohydrate, (MACROBID) 100 MG capsule  Take 1 capsule (100 mg total) by mouth 2 (two) times daily. 14 capsule 0  . tamsulosin (FLOMAX) 0.4 MG CAPS capsule Take 1 capsule (0.4 mg total) by mouth daily as needed (Stone symptoms). 30 capsule 3   No facility-administered medications prior to visit.       ROS:  Review of Systems  Constitutional: Negative for fatigue, fever and unexpected weight change.  Respiratory: Negative for cough, shortness of breath and wheezing.   Cardiovascular: Negative for chest pain, palpitations and leg swelling.  Gastrointestinal: Negative for blood in stool, constipation, diarrhea, nausea and vomiting.  Endocrine: Negative for cold intolerance, heat intolerance and polyuria.  Genitourinary: Positive for dysuria, hematuria, pelvic pain and vaginal discharge. Negative for dyspareunia, flank pain, frequency, genital sores, menstrual problem, urgency, vaginal bleeding and vaginal pain.  Musculoskeletal: Negative for back pain, joint swelling and myalgias.  Skin: Negative for rash.  Neurological: Negative for dizziness, syncope, light-headedness, numbness and headaches.  Hematological: Negative for adenopathy.  Psychiatric/Behavioral: Negative for agitation, confusion, sleep disturbance and suicidal ideas. The patient is not nervous/anxious.    BREAST: No symptoms    Objective: BP 90/60   Ht 5\' 5"  (1.651 m)   Wt 109 lb (49.4 kg)   LMP  (LMP Unknown)   BMI 18.14 kg/m    Physical Exam Constitutional:      Appearance: She is well-developed.  Genitourinary:     Vulva and vagina normal.     No vaginal discharge, erythema or tenderness.     Cervix is absent.     Uterus is  absent.     No right or left adnexal mass present.     Right adnexa absent.     Right adnexa not tender.     Left adnexa absent.     Left adnexa not tender.     Genitourinary Comments: UTERUS/CX SURG REM  Rectum:     Guaiac result negative.  Neck:     Thyroid: No thyromegaly.  Cardiovascular:     Rate and Rhythm: Normal rate and regular rhythm.     Heart sounds: Normal heart sounds. No murmur.  Pulmonary:     Effort: Pulmonary effort is normal.     Breath sounds: Normal breath sounds.  Chest:     Breasts:        Right: No mass, nipple discharge, skin change or tenderness.        Left: No mass, nipple discharge, skin change or tenderness.  Abdominal:     Palpations: Abdomen is soft.     Tenderness: There is no abdominal tenderness. There is no guarding.  Musculoskeletal:        General: Normal range of motion.     Cervical back: Normal range of motion.  Neurological:     General: No focal deficit present.     Mental Status: She is alert and oriented to person, place, and time.     Cranial Nerves: No cranial nerve deficit.  Skin:    General: Skin is warm and dry.  Psychiatric:        Mood and Affect: Mood normal.        Behavior: Behavior normal.        Thought Content: Thought content normal.        Judgment: Judgment normal.  Vitals reviewed.     Results: Results for orders placed or performed in visit on 04/05/20 (from the past 24 hour(s))  POCT Occult Blood Stool     Status: Normal  Collection Time: 04/05/20 10:38 AM  Result Value Ref Range   Fecal Occult Blood, POC Negative Negative   Card #1 Date     Card #2 Fecal Occult Blod, POC     Card #2 Date     Card #3 Fecal Occult Blood, POC     Card #3 Date    POCT Wet Prep with KOH     Status: Abnormal   Collection Time: 04/05/20 10:39 AM  Result Value Ref Range   Trichomonas, UA Negative    Clue Cells Wet Prep HPF POC pos    Epithelial Wet Prep HPF POC     Yeast Wet Prep HPF POC neg    Bacteria Wet Prep HPF  POC     RBC Wet Prep HPF POC     WBC Wet Prep HPF POC     KOH Prep POC Positive (A) Negative  POCT Urinalysis Dipstick     Status: Abnormal   Collection Time: 04/05/20 10:39 AM  Result Value Ref Range   Color, UA yellow    Clarity, UA clear    Glucose, UA Negative Negative   Bilirubin, UA neg    Ketones, UA neg    Spec Grav, UA 1.020 1.010 - 1.025   Blood, UA neg    pH, UA 5.0 5.0 - 8.0   Protein, UA Negative Negative   Urobilinogen, UA     Nitrite, UA neg    Leukocytes, UA Trace (A) Negative   Appearance     Odor      Assessment/Plan:  Encounter for annual routine gynecological examination  Encounter for screening mammogram for malignant neoplasm of breast; pt has mammo appt  UTI symptoms - Plan: Urine Culture, POCT Urinalysis Dipstick; Neg UA. Check C&S. If neg, most likely kidney stones.  Bacterial vaginosis - Plan: metroNIDAZOLE (FLAGYL) 500 MG tablet, POCT Wet Prep with KOH; Pos sx and wet prep. Rx flagyl, no EtOH. F/u prn. Will RF if sx recur.  Vasomotor symptoms due to menopause - Rx RF estradiol. F/u pnr.  - Plan: estradiol (ESTRACE) 2 MG tablet; Rx RF. Will discuss decreasing dose next yr.  Screening for colon cancer - Plan: POCT Occult Blood Stool; neg FOBT. Doing cologuard with PCP   Meds ordered this encounter  Medications  . metroNIDAZOLE (FLAGYL) 500 MG tablet    Sig: Take 1 tablet (500 mg total) by mouth 2 (two) times daily for 7 days.    Dispense:  14 tablet    Refill:  0    Order Specific Question:   Supervising Provider    Answer:   Gae Dry [id number]  . estradiol (ESTRACE) 2 MG tablet    Sig: Take 1 tablet (2 mg total) by mouth daily.    Dispense:  90 tablet    Refill:  3    Order Specific Question:   Supervising Provider    Answer:   Gae Dry [id number]            GYN counsel breast self exam, mammography screening, menopause, adequate intake of calcium and vitamin D, diet and exercise    F/U  Return in about 1 year (around  04/05/2021).  Alicia B. Copland, PA-C 04/06/2020 10:19 AM

## 2020-04-06 ENCOUNTER — Encounter: Payer: Self-pay | Admitting: Obstetrics and Gynecology

## 2020-04-09 LAB — URINE CULTURE

## 2020-04-10 MED ORDER — NITROFURANTOIN MONOHYD MACRO 100 MG PO CAPS: 100.0000 mg | ORAL_CAPSULE | Freq: Two times a day (BID) | ORAL | 0 refills | Status: AC

## 2020-04-10 NOTE — Progress Notes (Signed)
Pt aware.

## 2020-04-10 NOTE — Progress Notes (Signed)
Pls let pt know C&S showed UTI. Abx eRxd. F/u prn.

## 2020-04-10 NOTE — Addendum Note (Signed)
Addended by: Ardeth Perfect B on: 0000000 09:29 AM   Modules accepted: Orders

## 2020-04-19 ENCOUNTER — Other Ambulatory Visit: Payer: Self-pay | Admitting: Obstetrics and Gynecology

## 2020-04-19 ENCOUNTER — Telehealth: Payer: Self-pay

## 2020-04-19 DIAGNOSIS — B9689 Other specified bacterial agents as the cause of diseases classified elsewhere: Secondary | ICD-10-CM

## 2020-04-19 MED ORDER — METRONIDAZOLE 500 MG PO TABS: 500.0000 mg | ORAL_TABLET | Freq: Two times a day (BID) | ORAL | 0 refills | Status: AC

## 2020-04-19 MED ORDER — FLUCONAZOLE 150 MG PO TABS: 150.0000 mg | ORAL_TABLET | Freq: Once | ORAL | 0 refills | Status: DC

## 2020-04-19 NOTE — Telephone Encounter (Signed)
Rx RF flagyl for BV sx and Rx diflucan for yeast vag sx due to abx use,  eRxd. F/u prn sx. RTO if sx persist/worsen.

## 2020-04-19 NOTE — Telephone Encounter (Signed)
Patient reports she was treated by [facility] for a UTI & BV. She was advised to call back if it reoccurred in 2-3 wks and it has. She is requesting refill. Cb#[phone]

## 2020-04-19 NOTE — Progress Notes (Signed)
Rx RF flagyl for BV. Added diflucan prn yeast vag due to abx tx for UTI and BV. RTO if sx persist for further eval.

## 2020-04-19 NOTE — Telephone Encounter (Signed)
Pt aware.

## 2020-04-19 NOTE — Telephone Encounter (Signed)
Spoke w/patient to clarify symptoms. She is still having burning/itching/raw and has odor. She is uncertain d/t neuropathy if still UTI symptoms. It burns on the outside.

## 2020-04-23 ENCOUNTER — Other Ambulatory Visit: Payer: Self-pay

## 2020-04-23 ENCOUNTER — Ambulatory Visit
Admission: RE | Admit: 2020-04-23 | Discharge: 2020-04-23 | Disposition: A | Discharge status: Home / Self Care | Payer: 59 | Source: Ambulatory Visit | Attending: Certified Nurse Midwife | Admitting: Certified Nurse Midwife

## 2020-04-23 DIAGNOSIS — Z1231 Encounter for screening mammogram for malignant neoplasm of breast: Secondary | ICD-10-CM | POA: Insufficient documentation

## 2020-05-09 ENCOUNTER — Other Ambulatory Visit: Payer: Self-pay

## 2020-05-09 ENCOUNTER — Ambulatory Visit: Payer: 59 | Admitting: Dermatology

## 2020-05-09 DIAGNOSIS — D1800 Hemangioma unspecified site: Secondary | ICD-10-CM

## 2020-05-09 DIAGNOSIS — D492 Neoplasm of unspecified behavior of bone, soft tissue, and skin: Secondary | ICD-10-CM

## 2020-05-09 DIAGNOSIS — D239 Other benign neoplasm of skin, unspecified: Secondary | ICD-10-CM

## 2020-05-09 DIAGNOSIS — L820 Inflamed seborrheic keratosis: Secondary | ICD-10-CM | POA: Diagnosis not present

## 2020-05-09 DIAGNOSIS — Z1283 Encounter for screening for malignant neoplasm of skin: Secondary | ICD-10-CM

## 2020-05-09 DIAGNOSIS — D229 Melanocytic nevi, unspecified: Secondary | ICD-10-CM

## 2020-05-09 DIAGNOSIS — L814 Other melanin hyperpigmentation: Secondary | ICD-10-CM

## 2020-05-09 DIAGNOSIS — D225 Melanocytic nevi of trunk: Secondary | ICD-10-CM | POA: Diagnosis not present

## 2020-05-09 DIAGNOSIS — L578 Other skin changes due to chronic exposure to nonionizing radiation: Secondary | ICD-10-CM

## 2020-05-09 DIAGNOSIS — L821 Other seborrheic keratosis: Secondary | ICD-10-CM

## 2020-05-09 DIAGNOSIS — L988 Other specified disorders of the skin and subcutaneous tissue: Secondary | ICD-10-CM | POA: Diagnosis not present

## 2020-05-09 DIAGNOSIS — D489 Neoplasm of uncertain behavior, unspecified: Secondary | ICD-10-CM

## 2020-05-09 HISTORY — DX: Other benign neoplasm of skin, unspecified: D23.9

## 2020-05-09 NOTE — Progress Notes (Signed)
New Patient Visit  Subjective  Patricia Love is a 54 y.o. female who presents for the following: Annual Exam (TBSE, pt c/o several spots on her hands, arms and legs ). Pt would like to discuss a treatment plan for her face.  The patient presents for Total-Body Skin Exam (TBSE) for skin cancer screening and mole check.  The following portions of the chart were reviewed this encounter and updated as appropriate:  Tobacco  Allergies  Meds  Problems  Med Hx  Surg Hx  Fam Hx     Review of Systems:  No other skin or systemic complaints except as noted in HPI or Assessment and Plan.  Objective  Well appearing patient in no apparent distress; mood and affect are within normal limits.  A full examination was performed including scalp, head, eyes, ears, nose, lips, neck, chest, axillae, abdomen, back, buttocks, bilateral upper extremities, bilateral lower extremities, hands, feet, fingers, toes, fingernails, and toenails. All findings within normal limits unless otherwise noted below.  Objective  hands, R medial knee (6): Erythematous keratotic or waxy stuck-on papule or plaque.   Objective  R mid back paraspinal: 0.4 cm Dark brown macule  Objective  R mid to upper back 5 cm lat to spine: 0.4 cm Dark brown macule  Objective  face: Rhytides and volume loss.   Images               Assessment & Plan    Inflamed seborrheic keratosis (6) hands, R medial knee  Destruction of lesion - hands, R medial knee Complexity: simple   Destruction method: cryotherapy   Informed consent: discussed and consent obtained   Timeout:  patient name, date of birth, surgical site, and procedure verified Lesion destroyed using liquid nitrogen: Yes   Region frozen until ice ball extended beyond lesion: Yes   Outcome: patient tolerated procedure well with no complications   Post-procedure details: wound care instructions given    Neoplasm of skin (2) R mid back  paraspinal  Epidermal / dermal shaving  Lesion diameter (cm):  0.4 Informed consent: discussed and consent obtained   Timeout: patient name, date of birth, surgical site, and procedure verified   Procedure prep:  Patient was prepped and draped in usual sterile fashion Prep type:  Isopropyl alcohol Anesthesia: the lesion was anesthetized in a standard fashion   Anesthetic:  1% lidocaine w/ epinephrine 1-100,000 buffered w/ 8.4% NaHCO3 Hemostasis achieved with: pressure, aluminum chloride and electrodesiccation   Outcome: patient tolerated procedure well   Post-procedure details: sterile dressing applied and wound care instructions given   Dressing type: bandage and petrolatum   Additional details:  Post defect 0.7 cm   Specimen 1 - Surgical pathology Differential Diagnosis: R/O Dysplastic nevus  Check Margins: No 0.4 cm Dark brown macule  R mid to upper back 5 cm lat to spine  Epidermal / dermal shaving  Lesion diameter (cm):  0.4 Informed consent: discussed and consent obtained   Timeout: patient name, date of birth, surgical site, and procedure verified   Procedure prep:  Patient was prepped and draped in usual sterile fashion Prep type:  Isopropyl alcohol Anesthesia: the lesion was anesthetized in a standard fashion   Anesthetic:  1% lidocaine w/ epinephrine 1-100,000 buffered w/ 8.4% NaHCO3 Hemostasis achieved with: pressure, aluminum chloride and electrodesiccation   Outcome: patient tolerated procedure well   Post-procedure details: sterile dressing applied and wound care instructions given   Dressing type: bandage and petrolatum   Additional details:  Post defect 0.7 cm   Specimen 2 - Surgical pathology Differential Diagnosis: R/O Dysplastic nevus  Check Margins: No 0.4 cm Dark brown macule  Elastosis of skin face  Botox 20 units injected to frown comples  Botox Injection - face Location: See attached image  Informed consent: Discussed risks (infection, pain,  bleeding, bruising, swelling, allergic reaction, paralysis of nearby muscles, eyelid droop, double vision, neck weakness, difficulty breathing, headache, undesirable cosmetic result, and need for additional treatment) and benefits of the procedure, as well as the alternatives.  Informed consent was obtained.  Preparation: The area was cleansed with alcohol.  Procedure Details:  Botox was injected into the dermis with a 30-gauge needle. Pressure applied to any bleeding. Ice packs offered for swelling.  Lot Number:  [id number] Expiration:  09/2022  Total Units Injected:  20 units  Plan: Patient was instructed to remain upright for 4 hours. Patient was instructed to avoid massaging the face and avoid vigorous exercise for the rest of the day. Tylenol may be used for headache.  Allow 2 weeks before returning to clinic for additional dosing as needed. Patient will call for any problems.   Skin cancer screening  Lentigines - Scattered tan macules - Discussed due to sun exposure - Benign, observe - Call for any changes  Seborrheic Keratoses - Stuck-on, waxy, tan-brown papules and plaques  - Discussed benign etiology and prognosis. - Observe - Call for any changes  Melanocytic Nevi - Tan-brown and/or pink-flesh-colored symmetric macules and papules - Benign appearing on exam today - Observation - Call clinic for new or changing moles - Recommend daily use of broad spectrum spf 30+ sunscreen to sun-exposed areas.   Hemangiomas - Red papules - Discussed benign nature - Observe - Call for any changes  Actinic Damage - diffuse scaly erythematous macules with underlying dyspigmentation - Recommend daily broad spectrum sunscreen SPF 30+ to sun-exposed areas, reapply every 2 hours as needed.  - Call for new or changing lesions.  Skin cancer screening performed today.  Return in about 4 months (around 09/08/2020).  IMarye Round, CMA, am acting as scribe for Sarina Ser, MD  .  Documentation: I have reviewed the above documentation for accuracy and completeness, and I agree with the above.  Sarina Ser, MD

## 2020-05-09 NOTE — Patient Instructions (Addendum)
Cryotherapy Aftercare ° °• Wash gently with soap and water everyday.   °• Apply Vaseline and Band-Aid daily until healed. °Wound Care Instructions ° °On the day following your surgery, you should begin doing daily dressing changes: °Remove the old dressing and discard it. °Cleanse the wound gently with tap water. This may be done in the shower or by placing a wet gauze pad directly on the wound and letting it soak for several minutes. °It is important to gently remove any dried blood from the wound in order to encourage healing. This may be done by gently rolling a moistened Q-tip on the dried blood. Do not pick at the wound. °If the wound should start to bleed, continue cleaning the wound, then place a moist gauze pad on the wound and hold pressure for a few minutes.  °Make sure you then dry the skin surrounding the wound completely or the tape will not stick to the skin. Do not use cotton balls on the wound. °After the wound is clean and dry, apply the ointment gently with a Q-tip. °Cut a non-stick pad to fit the size of the wound. Lay the pad flush to the wound. If the wound is draining, you may want to reinforce it with a small amount of gauze on top of the non-stick pad for a little added compression to the area. °Use the tape to seal the area completely. °Select from the following with respect to your individual situation: °If your wound has been stitched closed: continue the above steps 1-8 at least daily until your sutures are removed. °If your wound has been left open to heal: continue steps 1-8 at least daily for the first 3-4 weeks. °We would like for you to take a few extra precautions for at least the next week. °Sleep with your head elevated on pillows if our wound is on your head. °Do not bend over or lift heavy items to reduce the chance of elevated blood pressure to the wound °Do not participate in particularly strenuous activities. ° ° °Below is a list of dressing supplies you might need.   °Cotton-tipped applicators - Q-tips °Gauze pads (2x2 and/or 4x4) - All-Purpose Sponges °Non-stick dressing material - Telfa °Tape - Paper or Hypafix °New and clean tube of petroleum jelly - Vaseline  ° ° °Comments on Post-Operative Period °Slight swelling and redness often appear around the wound. This is normal and will disappear within several days following the surgery. °The healing wound will drain a brownish-red-yellow discharge during healing. This is a normal phase of wound healing. As the wound begins to heal, the drainage may increase in amount. Again, this drainage is normal. °Notify us if the drainage becomes persistently bloody, excessively swollen, or intensely painful or develops a foul odor or red streaks.  °If you should experience mild discomfort during the healing phase, you may take an aspirin-free medication such as Tylenol (acetaminophen). Notify us if the discomfort is severe or persistent. Avoid alcoholic beverages when taking pain medicine. ° °In Case of Wound Hemorrhage °A wound hemorrhage is when the bandage suddenly becomes soaked with bright red blood and flows profusely. If this happens, sit down or lie down with your head elevated. If the wound has a dressing on it, do not remove the dressing. Apply pressure to the existing gauze. If the wound is not covered, use a gauze pad to apply pressure and continue applying the pressure for 20 minutes without peeking. DO NOT COVER THE WOUND WITH A LARGE TOWEL   OR WASH CLOTH. Release your hand from the wound site but do not remove the dressing. If the bleeding has stopped, gently clean around the wound. Leave the dressing in place for 24 hours if possible. This wait time allows the blood vessels to close off so that you do not spark a new round of bleeding by disrupting the newly clotted blood vessels with an immediate dressing change. If the bleeding does not subside, continue to hold pressure. If matters are out of your control, contact an After  Hours clinic or go to the Emergency Room. ° °•  °•  °

## 2020-05-21 ENCOUNTER — Encounter: Payer: Self-pay | Admitting: Dermatology

## 2020-05-21 ENCOUNTER — Telehealth: Payer: Self-pay

## 2020-05-21 NOTE — Telephone Encounter (Signed)
Advised pt of bx results/sh ?

## 2020-05-21 NOTE — Telephone Encounter (Signed)
-----   Message from Ralene Bathe, MD sent at 05/20/2020  7:17 PM EDT ----- 1. Skin , right mid back paraspinal DYSPLASTIC COMPOUND NEVUS WITH MODERATE ATYPIA, CLOSE TO MARGIN 2. Skin , right mid to upper back 5cm lat to spine DYSPLASTIC JUNCTIONAL NEVUS WITH MODERATE ATYPIA, CLOSE TO MARGIN  1&2 - both dysplastic Moderate Recheck next visit

## 2020-05-30 ENCOUNTER — Other Ambulatory Visit: Payer: Self-pay

## 2020-05-30 ENCOUNTER — Encounter: Payer: Self-pay | Admitting: *Deleted

## 2020-05-30 ENCOUNTER — Emergency Department
Admission: EM | Admit: 2020-05-30 | Discharge: 2020-05-30 | Disposition: A | Discharge status: Home / Self Care | Payer: 59 | Attending: Emergency Medicine | Admitting: Emergency Medicine

## 2020-05-30 DIAGNOSIS — R319 Hematuria, unspecified: Secondary | ICD-10-CM | POA: Insufficient documentation

## 2020-05-30 DIAGNOSIS — R109 Unspecified abdominal pain: Secondary | ICD-10-CM | POA: Diagnosis not present

## 2020-05-30 DIAGNOSIS — Z5321 Procedure and treatment not carried out due to patient leaving prior to being seen by health care provider: Secondary | ICD-10-CM | POA: Diagnosis not present

## 2020-05-30 DIAGNOSIS — R509 Fever, unspecified: Secondary | ICD-10-CM | POA: Diagnosis present

## 2020-05-30 LAB — URINALYSIS, COMPLETE (UACMP) WITH MICROSCOPIC
Bilirubin Urine: NEGATIVE
Glucose, UA: NEGATIVE mg/dL
Ketones, ur: NEGATIVE mg/dL
Nitrite: POSITIVE — AB
Protein, ur: 30 mg/dL — AB
Specific Gravity, Urine: 1.028 (ref 1.005–1.030)
WBC, UA: >50 WBC/hpf — ABNORMAL HIGH (ref 0–5)
pH: 6 (ref 5.0–8.0)

## 2020-05-30 LAB — CBC
HCT: 33.9 % — ABNORMAL LOW (ref 36.0–46.0)
Hemoglobin: 11.2 g/dL — ABNORMAL LOW (ref 12.0–15.0)
MCH: 30.3 pg (ref 26.0–34.0)
MCHC: 33 g/dL (ref 30.0–36.0)
MCV: 91.6 fL (ref 80.0–100.0)
Platelets: 195 10*3/uL (ref 150–400)
RBC: 3.7 MIL/uL — ABNORMAL LOW (ref 3.87–5.11)
RDW: 13.8 % (ref 11.5–15.5)
WBC: 9.4 10*3/uL (ref 4.0–10.5)
nRBC: 0 % (ref 0.0–0.2)

## 2020-05-30 LAB — COMPREHENSIVE METABOLIC PANEL
ALT: 14 U/L (ref 0–44)
AST: 17 U/L (ref 15–41)
Albumin: 3.6 g/dL (ref 3.5–5.0)
Alkaline Phosphatase: 43 U/L (ref 38–126)
Anion gap: 4 — ABNORMAL LOW (ref 5–15)
BUN: 16 mg/dL (ref 6–20)
CO2: 28 mmol/L (ref 22–32)
Calcium: 8.4 mg/dL — ABNORMAL LOW (ref 8.9–10.3)
Chloride: 104 mmol/L (ref 98–111)
Creatinine, Ser: 1.32 mg/dL — ABNORMAL HIGH (ref 0.44–1.00)
GFR calc Af Amer: 54 mL/min — ABNORMAL LOW (ref >60)
GFR calc non Af Amer: 46 mL/min — ABNORMAL LOW (ref >60)
Glucose, Bld: 130 mg/dL — ABNORMAL HIGH (ref 70–99)
Potassium: 4.5 mmol/L (ref 3.5–5.1)
Sodium: 136 mmol/L (ref 135–145)
Total Bilirubin: 0.6 mg/dL (ref 0.3–1.2)
Total Protein: 6.6 g/dL (ref 6.5–8.1)

## 2020-05-30 MED ORDER — ACETAMINOPHEN 500 MG PO TABS
1000.0000 mg | ORAL_TABLET | Freq: Once | ORAL | Status: AC
  Administered 2020-05-30: 1000 mg via ORAL
  Filled 2020-05-30: qty 2

## 2020-05-30 MED ORDER — SODIUM CHLORIDE 0.9 % IV BOLUS
1000.0000 mL | Freq: Once | INTRAVENOUS | Status: AC
  Administered 2020-05-30: 1000 mL via INTRAVENOUS

## 2020-05-30 NOTE — ED Triage Notes (Signed)
Pt brought in via ems with a fever, hematuria and right flank pain.  Hx kidney stones.  Iv in place  Pt alert.

## 2020-05-31 ENCOUNTER — Telehealth: Payer: Self-pay | Admitting: Emergency Medicine

## 2020-05-31 NOTE — Telephone Encounter (Signed)
Called patient due to lwot to inquire about condition and follow up plans. She says she is admitted at [facility] right now.

## 2020-06-03 ENCOUNTER — Other Ambulatory Visit: Payer: Self-pay | Admitting: Certified Nurse Midwife

## 2020-06-03 DIAGNOSIS — N951 Menopausal and female climacteric states: Secondary | ICD-10-CM

## 2020-06-12 ENCOUNTER — Ambulatory Visit: Payer: 59 | Admitting: Urology

## 2020-06-13 ENCOUNTER — Other Ambulatory Visit: Payer: Self-pay | Admitting: *Deleted

## 2020-06-13 ENCOUNTER — Other Ambulatory Visit: Payer: Self-pay

## 2020-06-13 ENCOUNTER — Ambulatory Visit
Admission: RE | Admit: 2020-06-13 | Discharge: 2020-06-13 | Disposition: A | Discharge status: Home / Self Care | Payer: 59 | Attending: Urology | Admitting: Urology

## 2020-06-13 ENCOUNTER — Ambulatory Visit (INDEPENDENT_AMBULATORY_CARE_PROVIDER_SITE_OTHER): Payer: 59 | Admitting: Urology

## 2020-06-13 ENCOUNTER — Ambulatory Visit
Admission: RE | Admit: 2020-06-13 | Discharge: 2020-06-13 | Disposition: A | Discharge status: Home / Self Care | Payer: 59 | Source: Ambulatory Visit | Attending: Urology | Admitting: Urology

## 2020-06-13 ENCOUNTER — Encounter: Payer: Self-pay | Admitting: Urology

## 2020-06-13 VITALS — BP 110/6 | HR 78 | Ht 65.0 in | Wt 109.0 lb

## 2020-06-13 DIAGNOSIS — N200 Calculus of kidney: Secondary | ICD-10-CM | POA: Insufficient documentation

## 2020-06-14 ENCOUNTER — Other Ambulatory Visit: Payer: Self-pay | Admitting: Certified Nurse Midwife

## 2020-06-14 DIAGNOSIS — N951 Menopausal and female climacteric states: Secondary | ICD-10-CM

## 2020-06-15 ENCOUNTER — Encounter: Payer: Self-pay | Admitting: Urology

## 2020-06-15 ENCOUNTER — Telehealth: Payer: Self-pay | Admitting: Family Medicine

## 2020-06-15 MED ORDER — OXYBUTYNIN CHLORIDE 5 MG PO TABS: 5.0000 mg | ORAL_TABLET | Freq: Three times a day (TID) | ORAL | 2 refills | Status: DC | PRN

## 2020-06-15 MED ORDER — POTASSIUM CITRATE ER 10 MEQ (1080 MG) PO TBCR: 20.0000 meq | EXTENDED_RELEASE_TABLET | ORAL | 3 refills | Status: DC

## 2020-06-15 MED ORDER — TAMSULOSIN HCL 0.4 MG PO CAPS: 0.4000 mg | ORAL_CAPSULE | Freq: Every day | ORAL | 2 refills | Status: DC

## 2020-06-15 NOTE — Telephone Encounter (Signed)
Patient notified and voiced understanding.

## 2020-06-15 NOTE — Telephone Encounter (Signed)
-----   Message from Abbie Sons, MD sent at 06/15/2020  3:22 PM EDT ----- KUB was reviewed and there are 2 calculi visible overlying the right renal outline which is stable from the KUB of last year.  Her CT while hospitalized showed "multiple" renal calculi.  Those images are not available for review.  Some of the calculi seen on CT are not visualized on KUB.  Follow-up as scheduled

## 2020-06-15 NOTE — Progress Notes (Signed)
06/13/2020 7:06 AM   Patricia Love 22-Jan-1966 [id number]  Referring provider: Idelle Selley, MD [place] [place],  [place] [address]  Chief Complaint  Patient presents with  . Nephrolithiasis    HPI: 54 y.o. female presents for annual follow-up nephrolithiasis.   Hospitalized at [facility] 05/31/20 for sepsis from urinary source  Urine culture grew E. coli/blood cultures negative  CT remarkable for multiple nonobstructing right renal calculi measuring up to 5 mm, mild perinephric/periureteral stranding  No ureteral calculi or hydronephrosis  Has passed a small stone since hospitalization  No complaints today  Remains on potassium citrate  Was having pelvic discomfort during her hospitalization and treated with oxybutynin which was effective   PMH: Past Medical History:  Diagnosis Date  . Anxiety   . Basal cell carcinoma   . Depression   . Endometriosis   . [place] Patient virus infection   . Hyperlipidemia   . Hypertension   . Kidney stones   . Migraine   . Neuropathy    left breast after reduction mammoplasty, seen at pain clinic    Surgical History: Past Surgical History:  Procedure Laterality Date  . ABDOMINAL HYSTERECTOMY    . BASAL CELL CARCINOMA EXCISION  2014   right wrist  . DIAGNOSTIC LAPAROSCOPY  [id number]   OSIS patent tubes  . ELBOW SURGERY    . kidney stone removal    . LITHOTRIPSY     x4; 05/2016  . REDUCTION MAMMAPLASTY  2012  . reduction mammoplasty  2012   Dr. Tula Nakayama  . TOTAL ABDOMINAL HYSTERECTOMY W/ BILATERAL SALPINGOOPHORECTOMY  2005   adenomyosis and severe endometriosis. Dr Rayford Halsted    Home Medications:  Allergies as of 06/13/2020      Reactions   Contrast Media  [iodinated Diagnostic Agents] Anaphylaxis   Meloxicam Swelling   Pregabalin Other (See Comments)   Atorvastatin Other (See Comments)   Elevated cholesterol   Butorphanol Tartrate Other (See Comments)   Patient states she is not allergic  to this   Chlorphen-diphenhyd-pe-apap    Other reaction(s): Other (See Comments)   Gabapentin    Gemfibrozil Other (See Comments)   Verapamil Other (See Comments)   Codeine Sulfate Nausea Only      Medication List       Accurate as of June 13, 2020 11:59 PM. If you have any questions, ask your nurse or doctor.        ALPRAZolam 0.5 MG tablet Commonly known as: XANAX Take 0.5 mg by mouth 2 (two) times daily as needed.   amitriptyline 25 MG tablet Commonly known as: ELAVIL Take 1 tablet by mouth daily.   atenolol 50 MG tablet Commonly known as: TENORMIN Take 1 tablet by mouth daily.   buPROPion 150 MG 12 hr tablet Commonly known as: WELLBUTRIN SR Take 150 mg by mouth 2 (two) times daily.   butalbital-acetaminophen-caffeine 50-325-40 MG tablet Commonly known as: FIORICET Take 2 tablets by mouth every 4 (four) hours as needed.   ciprofloxacin 500 MG tablet Commonly known as: CIPRO Take 500 mg by mouth 2 (two) times daily.   clotrimazole-betamethasone cream Commonly known as: Lotrisone Apply 1 application topically 2 (two) times daily as needed.   DULoxetine 30 MG capsule Commonly known as: CYMBALTA Take 1 capsule by mouth daily.   estradiol 2 MG tablet Commonly known as: ESTRACE Take 1 tablet (2 mg total) by mouth daily.   ezetimibe 10 MG tablet Commonly known as: ZETIA Take 1  tablet by mouth daily.   ferrous sulfate 325 (65 FE) MG tablet Take 1 tablet by mouth every other day.   furosemide 40 MG tablet Commonly known as: LASIX Take 1.5 tablets by mouth daily.   HYDROcodone-acetaminophen 10-325 MG tablet Commonly known as: NORCO Take 1 tablet by mouth every 6 (six) hours as needed.   oxybutynin 5 MG tablet Commonly known as: DITROPAN Take by mouth.   potassium chloride SA 20 MEQ tablet Commonly known as: KLOR-CON Take 4 tablets by mouth every morning.   potassium citrate 10 MEQ (1080 MG) SR tablet Commonly known as: UROCIT-K Take 2 tablets (20  mEq total) by mouth every morning.   promethazine 25 MG tablet Commonly known as: PHENERGAN Take 1 tablet by mouth every 6 (six) hours as needed.   rosuvastatin 20 MG tablet Commonly known as: CRESTOR Take 1 tablet by mouth daily.   SUMAtriptan 100 MG tablet Commonly known as: IMITREX Take 1 tablet by mouth daily as needed.   tamsulosin 0.4 MG Caps capsule Commonly known as: FLOMAX Take by mouth.   topiramate 25 MG tablet Commonly known as: TOPAMAX Take 3 tablets by mouth 2 (two) times daily.       Allergies:  Allergies  Allergen Reactions  . Contrast Media  [Iodinated Diagnostic Agents] Anaphylaxis  . Meloxicam Swelling  . Pregabalin Other (See Comments)  . Atorvastatin Other (See Comments)    Elevated cholesterol  . Butorphanol Tartrate Other (See Comments)    Patient states she is not allergic to this  . Chlorphen-Diphenhyd-Pe-Apap     Other reaction(s): Other (See Comments)  . Gabapentin   . Gemfibrozil Other (See Comments)  . Verapamil Other (See Comments)  . Codeine Sulfate Nausea Only    Family History: Family History  Problem Relation Age of Onset  . Hyperlipidemia Mother   . Hypertension Mother   . Depression Mother   . Stroke Mother   . Hypertension Father   . Diabetes Father   . Hyperlipidemia Sister   . Hypertension Sister   . Depression Sister   . Ovarian cancer Maternal Grandmother 82  . Breast cancer Neg Hx     Social History:  reports that she has never smoked. She has never used smokeless tobacco. She reports that she does not drink alcohol and does not use drugs.   Physical Exam: BP (!) 110/6   Pulse 78   Ht 5\' 5"  (1.651 m)   Wt 109 lb (49.4 kg)   LMP  (LMP Unknown)   BMI 18.14 kg/m   Constitutional:  Alert and oriented, No acute distress. HEENT: [place] AT, moist mucus membranes.  Trachea midline, no masses. Cardiovascular: No clubbing, cyanosis, or edema. Respiratory: Normal respiratory effort, no increased work of  breathing. Skin: No rashes, bruises or suspicious lesions. Neurologic: Grossly intact, no focal deficits, moving all 4 extremities. Psychiatric: Normal mood and affect.   Assessment & Plan:    1.  Nephrolithiasis  Recent hospitalization sepsis from urinary source  Imaging showed no ureteral calculi or obstruction  Nonobstructing left renal calculi  Continue potassium citrate  Refill oxybutynin  Refill tamsulosin to take prn  Follow-up 1 year with [facility], earlier for renal colic/stone symptoms   Abbie Sons, MD  [facility] [address], [place], [place] [address] [phone]

## 2020-06-22 ENCOUNTER — Telehealth: Payer: Self-pay

## 2020-06-22 NOTE — Telephone Encounter (Signed)
Patient was seen on 04/02/20 with [facility] for her annual

## 2020-06-22 NOTE — Telephone Encounter (Signed)
Pt calling; is out of refills on estradiol 2mg ; almost out of pills; [facility]  [phone]

## 2020-06-23 ENCOUNTER — Other Ambulatory Visit: Payer: Self-pay | Admitting: Certified Nurse Midwife

## 2020-06-23 DIAGNOSIS — N951 Menopausal and female climacteric states: Secondary | ICD-10-CM

## 2020-06-23 MED ORDER — ESTRADIOL 2 MG PO TABS: 2.0000 mg | ORAL_TABLET | Freq: Every day | ORAL | 3 refills | Status: DC

## 2020-06-23 NOTE — Telephone Encounter (Signed)
done

## 2020-08-13 ENCOUNTER — Ambulatory Visit (INDEPENDENT_AMBULATORY_CARE_PROVIDER_SITE_OTHER): Payer: 59 | Admitting: Dermatology

## 2020-08-13 ENCOUNTER — Other Ambulatory Visit: Payer: Self-pay

## 2020-08-13 DIAGNOSIS — L578 Other skin changes due to chronic exposure to nonionizing radiation: Secondary | ICD-10-CM

## 2020-08-13 DIAGNOSIS — L821 Other seborrheic keratosis: Secondary | ICD-10-CM | POA: Diagnosis not present

## 2020-08-13 DIAGNOSIS — Z86018 Personal history of other benign neoplasm: Secondary | ICD-10-CM

## 2020-08-13 DIAGNOSIS — L988 Other specified disorders of the skin and subcutaneous tissue: Secondary | ICD-10-CM

## 2020-08-13 DIAGNOSIS — L820 Inflamed seborrheic keratosis: Secondary | ICD-10-CM

## 2020-08-13 NOTE — Patient Instructions (Signed)

## 2020-08-13 NOTE — Progress Notes (Signed)
° °  Follow-Up Visit   Subjective  Patricia Love is a 54 y.o. female who presents for the following: Follow-up. Patient presents today for follow up on OV 05/09/20 received cryotherapy for ISK R medial Knee, 2 bx proven mod dysplastic nevi on back and botox  The following portions of the chart were reviewed this encounter and updated as appropriate:  Tobacco   Allergies   Meds   Problems   Med Hx   Surg Hx   Fam Hx      Review of Systems:  No other skin or systemic complaints except as noted in HPI or Assessment and Plan.  Objective  Well appearing patient in no apparent distress; mood and affect are within normal limits.  A focused examination was performed including Face, Knee, Back. Relevant physical exam findings are noted in the Assessment and Plan.  Objective  Right Mid Paraspinal, Right Mid to Upper Back 5 cm lat to spine: Scar with no evidence of recurrence.   Objective  Head - Anterior (Face): Rhytides and volume loss.    Assessment & Plan  Inflamed seborrheic keratosis (2) Right Medial Knee; Forehead Left of midline  Cryotherapy today Prior to procedure, discussed risks of blister formation, small wound, skin dyspigmentation, or rare scar following cryotherapy.   Destruction of lesion - Forehead Left of midline, Right Medial Knee Complexity: simple   Destruction method: cryotherapy   Informed consent: discussed and consent obtained   Timeout:  patient name, date of birth, surgical site, and procedure verified Lesion destroyed using liquid nitrogen: Yes   Region frozen until ice ball extended beyond lesion: Yes   Outcome: patient tolerated procedure well with no complications   Post-procedure details: wound care instructions given    History of dysplastic nevus (2) Right Mid Paraspinal; Right Mid to Upper Back 5 cm lat to spine Clear. Observe for recurrence. Call clinic for new or changing lesions.  Recommend regular skin exams, daily broad-spectrum spf 30+  sunscreen use, and photoprotection.    Elastosis of skin Face Discussed Botox good results - should have done again in next 2 months;  Facial Hyaluronic Acid fillers for mid face and nasolabials - would likely need Voluma x 2 syringes and 2 syringes of Restylane for nasolabials and perioral areas. Likely would be aroung $2,800 for 4 syringes over 6 mos time (do not need to inject all at once. Then would need about 2-3 syringes per year after that. Also discussed Halo laser for fine lines of face  Actinic Damage - diffuse scaly erythematous macules with underlying dyspigmentation - Recommend daily broad spectrum sunscreen SPF 30+ to sun-exposed areas, reapply every 2 hours as needed.  - Call for new or changing lesions.  Seborrheic Keratoses - Stuck-on, waxy, tan-brown papules and plaques  - Discussed benign etiology and prognosis. - Observe - Call for any changes  Return in about 3 months (around 11/13/2020) for Botox, and UBSE.  I, Donzetta Kohut, CMA, am acting as scribe for Sarina Ser, MD . Documentation: I have reviewed the above documentation for accuracy and completeness, and I agree with the above.  Sarina Ser, MD

## 2020-08-14 ENCOUNTER — Encounter: Payer: Self-pay | Admitting: Dermatology

## 2020-08-17 ENCOUNTER — Other Ambulatory Visit: Payer: Self-pay | Admitting: Physician Assistant

## 2020-08-17 ENCOUNTER — Ambulatory Visit
Admission: RE | Admit: 2020-08-17 | Discharge: 2020-08-17 | Disposition: A | Discharge status: Home / Self Care | Payer: 59 | Source: Ambulatory Visit | Attending: Physician Assistant | Admitting: Physician Assistant

## 2020-08-17 ENCOUNTER — Other Ambulatory Visit: Payer: Self-pay

## 2020-08-17 ENCOUNTER — Ambulatory Visit
Admission: RE | Admit: 2020-08-17 | Discharge: 2020-08-17 | Disposition: A | Discharge status: Home / Self Care | Payer: 59 | Attending: Physician Assistant | Admitting: Physician Assistant

## 2020-08-17 DIAGNOSIS — M542 Cervicalgia: Secondary | ICD-10-CM | POA: Diagnosis present

## 2020-10-26 ENCOUNTER — Other Ambulatory Visit: Payer: Self-pay | Admitting: Urology

## 2020-11-14 ENCOUNTER — Ambulatory Visit (INDEPENDENT_AMBULATORY_CARE_PROVIDER_SITE_OTHER): Payer: 59 | Admitting: Dermatology

## 2020-11-14 ENCOUNTER — Other Ambulatory Visit: Payer: Self-pay

## 2020-11-14 ENCOUNTER — Encounter: Payer: Self-pay | Admitting: Dermatology

## 2020-11-14 DIAGNOSIS — L821 Other seborrheic keratosis: Secondary | ICD-10-CM | POA: Diagnosis not present

## 2020-11-14 DIAGNOSIS — L814 Other melanin hyperpigmentation: Secondary | ICD-10-CM | POA: Diagnosis not present

## 2020-11-14 DIAGNOSIS — L988 Other specified disorders of the skin and subcutaneous tissue: Secondary | ICD-10-CM | POA: Diagnosis not present

## 2020-11-14 DIAGNOSIS — D1800 Hemangioma unspecified site: Secondary | ICD-10-CM

## 2020-11-14 DIAGNOSIS — D229 Melanocytic nevi, unspecified: Secondary | ICD-10-CM

## 2020-11-14 DIAGNOSIS — Z86018 Personal history of other benign neoplasm: Secondary | ICD-10-CM

## 2020-11-14 DIAGNOSIS — L578 Other skin changes due to chronic exposure to nonionizing radiation: Secondary | ICD-10-CM

## 2020-11-14 NOTE — Progress Notes (Signed)
Follow-Up Visit   Subjective  Patricia Love is a 55 y.o. female who presents for the following: Facial Elastosis (Patient is here today for Botox injections). Patient also like to have her back checked today since she has a history of dysplastic nevi.   The following portions of the chart were reviewed this encounter and updated as appropriate:   Tobacco  Allergies  Meds  Problems  Med Hx  Surg Hx  Fam Hx     Review of Systems:  No other skin or systemic complaints except as noted in HPI or Assessment and Plan.  Objective  Well appearing patient in no apparent distress; mood and affect are within normal limits.  A focused examination was performed including the face, chest, and back. Relevant physical exam findings are noted in the Assessment and Plan.  Objective  Face: Rhytides and volume loss.   Images    Assessment & Plan  Elastosis of skin Face  Discussed The Perfect A - apply a pea sized amount to the entire face QHS.   Discussed filler to the lower face (perioral areas).   Botox 25 units injected into the frown complex.  Botox Injection - Face Location: See attached image  Informed consent: Discussed risks (infection, pain, bleeding, bruising, swelling, allergic reaction, paralysis of nearby muscles, eyelid droop, double vision, neck weakness, difficulty breathing, headache, undesirable cosmetic result, and need for additional treatment) and benefits of the procedure, as well as the alternatives.  Informed consent was obtained.  Preparation: The area was cleansed with alcohol.  Procedure Details:  Botox was injected into the dermis with a 30-gauge needle. Pressure applied to any bleeding. Ice packs offered for swelling.  Lot Number:  [id number] Expiration:  01/2023  Total Units Injected:  25  Plan: Patient was instructed to remain upright for 4 hours. Patient was instructed to avoid massaging the face and avoid vigorous exercise for the rest of the day.  Tylenol may be used for headache.  Allow 2 weeks before returning to clinic for additional dosing as needed. Patient will call for any problems.    Lentigines - Scattered tan macules - Discussed due to sun exposure - Benign, observe - Call for any changes  Seborrheic Keratoses - Stuck-on, waxy, tan-brown papules and plaques  - Discussed benign etiology and prognosis. - Observe - Call for any changes  Melanocytic Nevi - Tan-brown and/or pink-flesh-colored symmetric macules and papules - Benign appearing on exam today - Observation - Call clinic for new or changing moles - Recommend daily use of broad spectrum spf 30+ sunscreen to sun-exposed areas.   Hemangiomas - Red papules - Discussed benign nature - Observe - Call for any changes  Actinic Damage - Chronic, secondary to cumulative UV/sun exposure - diffuse scaly erythematous macules with underlying dyspigmentation - Recommend daily broad spectrum sunscreen SPF 30+ to sun-exposed areas, reapply every 2 hours as needed.  - Call for new or changing lesions.  History of Dysplastic Nevi - No evidence of recurrence today - Recommend regular full body skin exams - Recommend daily broad spectrum sunscreen SPF 30+ to sun-exposed areas, reapply every 2 hours as needed.  - Call if any new or changing lesions are noted between office visits  Return in about 3 months (around 02/12/2021) for cosmetic - Botox; 6 months TBSE .  Patricia Love, CMA, am acting as scribe for Armida Sans, MD .  Documentation: I have reviewed the above documentation for accuracy and completeness, and I agree with  the above.  Sarina Ser, MD

## 2020-11-26 ENCOUNTER — Other Ambulatory Visit: Payer: Self-pay | Admitting: Urology

## 2020-11-26 DIAGNOSIS — N200 Calculus of kidney: Secondary | ICD-10-CM

## 2021-01-21 ENCOUNTER — Other Ambulatory Visit: Payer: Self-pay | Admitting: Internal Medicine

## 2021-01-21 DIAGNOSIS — N182 Chronic kidney disease, stage 2 (mild): Secondary | ICD-10-CM

## 2021-02-11 ENCOUNTER — Ambulatory Visit
Admission: RE | Admit: 2021-02-11 | Discharge: 2021-02-11 | Disposition: A | Discharge status: Home / Self Care | Payer: 59 | Source: Ambulatory Visit | Attending: Internal Medicine | Admitting: Internal Medicine

## 2021-02-11 ENCOUNTER — Other Ambulatory Visit: Payer: Self-pay

## 2021-02-11 DIAGNOSIS — N182 Chronic kidney disease, stage 2 (mild): Secondary | ICD-10-CM | POA: Diagnosis present

## 2021-02-12 ENCOUNTER — Ambulatory Visit (INDEPENDENT_AMBULATORY_CARE_PROVIDER_SITE_OTHER): Payer: 59 | Admitting: Dermatology

## 2021-02-12 ENCOUNTER — Encounter: Payer: Self-pay | Admitting: Dermatology

## 2021-02-12 DIAGNOSIS — L209 Atopic dermatitis, unspecified: Secondary | ICD-10-CM | POA: Diagnosis not present

## 2021-02-12 DIAGNOSIS — L853 Xerosis cutis: Secondary | ICD-10-CM

## 2021-02-12 DIAGNOSIS — L988 Other specified disorders of the skin and subcutaneous tissue: Secondary | ICD-10-CM

## 2021-02-12 NOTE — Patient Instructions (Signed)

## 2021-02-12 NOTE — Progress Notes (Signed)
   Follow-Up Visit   Subjective  Patricia Love is a 55 y.o. female who presents for the following: Facial Elastosis (Patient is here today for Botox injections). Patient c/o dryness on the nose and would like to discuss treatment options.   The following portions of the chart were reviewed this encounter and updated as appropriate:   Tobacco  Allergies  Meds  Problems  Med Hx  Surg Hx  Fam Hx     Review of Systems:  No other skin or systemic complaints except as noted in  HPI or Assessment and Plan.  Objective  Well appearing patient in no apparent distress; mood and affect are within normal limits.  A focused examination was performed including the face. Relevant physical exam findings are noted in the Assessment and Plan.  Objective  Face: Rhytides and volume loss.   Images     Assessment & Plan  Elastosis of skin Face  Botox 25 units injected into - The frown complex  Botox Injection - Face Location: See attached image  Informed consent: Discussed risks (infection, pain, bleeding, bruising, swelling, allergic reaction, paralysis of nearby muscles, eyelid droop, double vision, neck weakness, difficulty breathing, headache, undesirable cosmetic result, and need for additional treatment) and benefits of the procedure, as well as the alternatives.  Informed consent was obtained.  Preparation: The area was cleansed with alcohol.  Procedure Details:  Botox was injected into the dermis with a 30-gauge needle. Pressure applied to any bleeding. Ice packs offered for swelling.  Lot Number:  [id number] Expiration:  04/2023  Total Units Injected:  25  Plan: Patient was instructed to remain upright for 4 hours. Patient was instructed to avoid massaging the face and avoid vigorous exercise for the rest of the day. Tylenol may be used for headache.  Allow 2 weeks before returning to clinic for additional dosing as needed. Patient will call for any problems.  Xerosis with  Atopic Dermatitis of face - Diffuse xerotic patches - Recommend gentle, hydrating skin care - Gentle skin care handout given - Samples given of Eucrisa apply to aa's QD-BID PRN - CeraVe or Vanicream moisturizer - samples given  Return in about 3 months (around 05/14/2021) for Botox injections.  Luther Redo, CMA, am acting as scribe for Sarina Ser, MD .  Documentation: I have reviewed the above documentation for accuracy and completeness, and I agree with the above.  Sarina Ser, MD

## 2021-02-13 MED ORDER — EUCRISA 2 % EX OINT: TOPICAL_OINTMENT | CUTANEOUS | 0 refills | Status: DC

## 2021-02-20 LAB — COLOGUARD: COLOGUARD: NEGATIVE

## 2021-02-20 LAB — EXTERNAL GENERIC LAB PROCEDURE: COLOGUARD: NEGATIVE

## 2021-04-29 ENCOUNTER — Ambulatory Visit: Payer: 59 | Admitting: Obstetrics

## 2021-04-30 ENCOUNTER — Ambulatory Visit: Payer: 59 | Admitting: Dermatology

## 2021-05-14 ENCOUNTER — Ambulatory Visit: Payer: 59 | Admitting: Obstetrics

## 2021-05-22 ENCOUNTER — Other Ambulatory Visit: Payer: Self-pay

## 2021-05-22 DIAGNOSIS — N951 Menopausal and female climacteric states: Secondary | ICD-10-CM

## 2021-05-22 MED ORDER — ESTRADIOL 2 MG PO TABS: 2.0000 mg | ORAL_TABLET | Freq: Every day | ORAL | 0 refills | Status: DC

## 2021-06-11 ENCOUNTER — Other Ambulatory Visit: Payer: Self-pay | Admitting: *Deleted

## 2021-06-11 DIAGNOSIS — N200 Calculus of kidney: Secondary | ICD-10-CM

## 2021-06-11 NOTE — Progress Notes (Signed)
PCP: Idelle Grigoryan, MD   Chief Complaint  Patient presents with   Gynecologic Exam    No concerns    HPI:      [name] is a 55 y.o. G1P1001 who LMP was No LMP recorded (lmp unknown). Patient has had a hysterectomy., presents today for her annual examination.  Her menses are absent due to [facility] for endometriosis in 2005. She does not have PMB..  She does not have vasomotor sx if takes estradiol 2 mg daily. Not ready to wean down.  Sex activity: not sexually active. Denies vag dryness.   Hx of kidney stones and chronic renal insufficiency stage 3B, seeing nephrology.  Also with heart palpitations, seeing cardio.  Last Pap: 11/23/18 Results: no abnormalities/neg HPV DNA Hx of STDs: none  Last mammogram: 04/23/20  Results were: normal--routine follow-up in 12 months There is no FH of breast cancer. There is a FH of ovarian cancer in her MGM, genetic testing not done. The patient does not do self-breast exams. S/p breast reduction.    Colonoscopy: never; NEG cologuard 2022, repeat in 3 yrs  Tobacco use: The patient denies current or previous tobacco use. Alcohol use: none  No drug use Exercise: not active due to recent heart issues and s/p covid  She does get adequate calcium and Vitamin D in her diet.  Labs with PCP.   Past Medical History:  Diagnosis Date   Anxiety    Basal cell carcinoma    Depression    Dysplastic nevus 05/09/2020   Right mid back paraspinal. Moderate atypia, close to margin.   Dysplastic nevus 05/09/2020   Right mid to upper back 5cm lat. to spine. Moderate atypia, close to margin.   Endometriosis    Epstein Barr virus infection    Hyperlipidemia    Hypertension    Kidney stones    Migraine    Neuropathy    left breast after reduction mammoplasty, seen at pain clinic    Past Surgical History:  Procedure Laterality Date   ABDOMINAL HYSTERECTOMY     BASAL CELL CARCINOMA EXCISION  2014   right wrist   DIAGNOSTIC LAPAROSCOPY   [id number]   OSIS patent tubes   ELBOW SURGERY     kidney stone removal     LITHOTRIPSY     x4; 05/2016   REDUCTION MAMMAPLASTY  2012   reduction mammoplasty  2012   Dr. Tula Nakayama   TOTAL ABDOMINAL HYSTERECTOMY W/ BILATERAL SALPINGOOPHORECTOMY  2005   adenomyosis and severe endometriosis. Dr Rayford Halsted    Family History  Problem Relation Age of Onset   Hyperlipidemia Mother    Hypertension Mother    Depression Mother    Stroke Mother    Hypertension Father    Diabetes Father    Hyperlipidemia Sister    Hypertension Sister    Depression Sister    Ovarian cancer Maternal Grandmother 61   Breast cancer Neg Hx     Social History   Socioeconomic History   Marital status: Divorced    Spouse name: Not on file   Number of children: 1   Years of education: 16   Highest education level: Not on file  Occupational History   Occupation: Location manager  Tobacco Use   Smoking status: Never   Smokeless tobacco: Never  Vaping Use   Vaping Use: Never used  Substance and Sexual Activity   Alcohol use: No   Drug use: No   Sexual activity: Not Currently  Birth control/protection: Surgical    Comment: Hysterectomy  Other Topics Concern   Not on file  Social History Narrative   Not on file   Social Determinants of Health   Financial Resource Strain: Not on file  Food Insecurity: Not on file  Transportation Needs: Not on file  Physical Activity: Not on file  Stress: Not on file  Social Connections: Not on file  Intimate Partner Violence: Not on file    Outpatient Medications Prior to Visit  Medication Sig Dispense Refill   ALPRAZolam (XANAX) 0.5 MG tablet Take by mouth.     amitriptyline (ELAVIL) 25 MG tablet Take 1 tablet by mouth daily.     atenolol (TENORMIN) 50 MG tablet Take 1 tablet by mouth daily.     buPROPion (WELLBUTRIN SR) 150 MG 12 hr tablet Take 150 mg by mouth 2 (two) times daily.     butalbital-acetaminophen-caffeine (FIORICET, ESGIC) 50-325-40 MG tablet Take  2 tablets by mouth every 4 (four) hours as needed.     cyanocobalamin 1000 MCG tablet Take by mouth.     DULoxetine (CYMBALTA) 30 MG capsule Take 1 capsule by mouth daily.     ezetimibe (ZETIA) 10 MG tablet Take 1 tablet by mouth daily.     folic acid (FOLVITE) 1 MG tablet Take by mouth.     furosemide (LASIX) 40 MG tablet Take 1.5 tablets by mouth daily.     HYDROcodone-acetaminophen (NORCO) 10-325 MG tablet Take 1 tablet by mouth every 6 (six) hours as needed.     potassium chloride SA (K-DUR,KLOR-CON) 20 MEQ tablet Take 4 tablets by mouth every morning.     potassium citrate (UROCIT-K) 10 MEQ (1080 MG) SR tablet TAKE 2 TABLETS BY MOUTH IN  THE MORNING 200 tablet 3   promethazine (PHENERGAN) 25 MG tablet Take 1 tablet by mouth every 6 (six) hours as needed.     rosuvastatin (CRESTOR) 20 MG tablet Take 1 tablet by mouth daily.     SUMAtriptan (IMITREX) 100 MG tablet Take 1 tablet by mouth daily as needed.     topiramate (TOPAMAX) 25 MG tablet Take 3 tablets by mouth 2 (two) times daily.     Vitamin D, Ergocalciferol, (DRISDOL) 1.25 MG (50000 UNIT) CAPS capsule Take by mouth.     estradiol (ESTRACE) 2 MG tablet Take 1 tablet (2 mg total) by mouth daily. 90 tablet 0   oxybutynin (DITROPAN) 5 MG tablet TAKE 1 TABLET(5 MG) BY MOUTH EVERY 8 HOURS AS NEEDED FOR BLADDER SPASMS 90 tablet 2   ALPRAZolam (XANAX) 0.5 MG tablet Take 0.5 mg by mouth 2 (two) times daily as needed.     ciprofloxacin (CIPRO) 500 MG tablet Take 500 mg by mouth 2 (two) times daily.     clotrimazole-betamethasone (LOTRISONE) cream Apply 1 application topically 2 (two) times daily as needed. 30 g 0   Crisaborole (EUCRISA) 2 % OINT Apply to aa's face QD-BID PRN 60 g 0   tamsulosin (FLOMAX) 0.4 MG CAPS capsule TAKE 1 CAPSULE(0.4 MG) BY MOUTH DAILY AFTER BREAKFAST 30 capsule 2   No facility-administered medications prior to visit.       ROS:  Review of Systems  Constitutional:  Negative for fatigue, fever and unexpected  weight change.  Respiratory:  Positive for shortness of breath. Negative for cough and wheezing.   Cardiovascular:  Positive for chest pain. Negative for palpitations and leg swelling.  Gastrointestinal:  Negative for blood in stool, constipation, diarrhea, nausea and vomiting.  Endocrine: Negative  for cold intolerance, heat intolerance and polyuria.  Genitourinary:  Negative for dyspareunia, dysuria, flank pain, frequency, genital sores, hematuria, menstrual problem, pelvic pain, urgency, vaginal bleeding, vaginal discharge and vaginal pain.  Musculoskeletal:  Negative for back pain, joint swelling and myalgias.  Skin:  Negative for rash.  Neurological:  Positive for dizziness. Negative for syncope, light-headedness, numbness and headaches.  Hematological:  Negative for adenopathy.  Psychiatric/Behavioral:  Negative for agitation, confusion, sleep disturbance and suicidal ideas. The patient is not nervous/anxious.   BREAST: No symptoms    Objective: BP 90/70   Ht '5\' 5"'$  (1.651 m)   Wt 101 lb (45.8 kg)   LMP  (LMP Unknown)   BMI 16.81 kg/m    Physical Exam Constitutional:      Appearance: She is well-developed.  Genitourinary:     Vulva normal.     Genitourinary Comments: UTERUS/CX SURG REM     Right Labia: No rash, tenderness or lesions.    Left Labia: No tenderness, lesions or rash.    Vaginal cuff intact.    No vaginal discharge, erythema or tenderness.      Right Adnexa: not tender and no mass present.    Left Adnexa: not tender and no mass present.    Cervix is absent.     Uterus is absent.  Breasts:    Right: No mass, nipple discharge, skin change or tenderness.     Left: No mass, nipple discharge, skin change or tenderness.  Neck:     Thyroid: No thyromegaly.  Cardiovascular:     Rate and Rhythm: Normal rate and regular rhythm.     Heart sounds: Normal heart sounds. No murmur heard. Pulmonary:     Effort: Pulmonary effort is normal.     Breath sounds: Normal  breath sounds.  Abdominal:     Palpations: Abdomen is soft.     Tenderness: There is no abdominal tenderness. There is no guarding.  Musculoskeletal:        General: Normal range of motion.     Cervical back: Normal range of motion.  Neurological:     General: No focal deficit present.     Mental Status: She is alert and oriented to person, place, and time.     Cranial Nerves: No cranial nerve deficit.  Skin:    General: Skin is warm and dry.  Psychiatric:        Mood and Affect: Mood normal.        Behavior: Behavior normal.        Thought Content: Thought content normal.        Judgment: Judgment normal.  Vitals reviewed.    Assessment/Plan: Encounter for annual routine gynecological examination  Encounter for screening mammogram for malignant neoplasm of breast - Plan: MM 3D SCREEN BREAST BILATERAL; pt to sched mammo  Vasomotor symptoms due to menopause - Plan: estradiol (ESTRACE) 2 MG tablet; Rx RF. F/u prn.   Family history of ovarian cancer--MyRisk testing discussed and pt declines. F/u prn.   Meds ordered this encounter  Medications   estradiol (ESTRACE) 2 MG tablet    Sig: Take 1 tablet (2 mg total) by mouth daily.    Dispense:  90 tablet    Refill:  3    Order Specific Question:   Supervising Provider    Answer:   Gae Dry [id number]           GYN counsel breast self exam, mammography screening, menopause, adequate intake of calcium and vitamin  D, diet and exercise    F/U  Return in about 1 year (around 06/12/2022).  Arleigh Dicola B. Kathrynn Backstrom, PA-C 06/12/2021 5:07 PM

## 2021-06-12 ENCOUNTER — Other Ambulatory Visit: Payer: Self-pay

## 2021-06-12 ENCOUNTER — Ambulatory Visit (INDEPENDENT_AMBULATORY_CARE_PROVIDER_SITE_OTHER): Payer: 59 | Admitting: Obstetrics and Gynecology

## 2021-06-12 ENCOUNTER — Ambulatory Visit
Admission: RE | Admit: 2021-06-12 | Discharge: 2021-06-12 | Disposition: A | Discharge status: Home / Self Care | Payer: 59 | Attending: Urology | Admitting: Urology

## 2021-06-12 ENCOUNTER — Encounter: Payer: Self-pay | Admitting: Obstetrics and Gynecology

## 2021-06-12 ENCOUNTER — Ambulatory Visit
Admission: RE | Admit: 2021-06-12 | Discharge: 2021-06-12 | Disposition: A | Discharge status: Home / Self Care | Payer: 59 | Source: Ambulatory Visit | Attending: Urology | Admitting: Urology

## 2021-06-12 ENCOUNTER — Encounter: Payer: Self-pay | Admitting: Urology

## 2021-06-12 ENCOUNTER — Ambulatory Visit (INDEPENDENT_AMBULATORY_CARE_PROVIDER_SITE_OTHER): Payer: Self-pay | Admitting: Urology

## 2021-06-12 VITALS — BP 102/63 | HR 101 | Ht 60.0 in | Wt 101.0 lb

## 2021-06-12 VITALS — BP 90/70 | Ht 65.0 in | Wt 101.0 lb

## 2021-06-12 DIAGNOSIS — N200 Calculus of kidney: Secondary | ICD-10-CM | POA: Diagnosis present

## 2021-06-12 DIAGNOSIS — N951 Menopausal and female climacteric states: Secondary | ICD-10-CM

## 2021-06-12 DIAGNOSIS — Z1211 Encounter for screening for malignant neoplasm of colon: Secondary | ICD-10-CM

## 2021-06-12 DIAGNOSIS — Z1231 Encounter for screening mammogram for malignant neoplasm of breast: Secondary | ICD-10-CM

## 2021-06-12 DIAGNOSIS — Z8041 Family history of malignant neoplasm of ovary: Secondary | ICD-10-CM | POA: Diagnosis not present

## 2021-06-12 DIAGNOSIS — Z01419 Encounter for gynecological examination (general) (routine) without abnormal findings: Secondary | ICD-10-CM | POA: Diagnosis not present

## 2021-06-12 MED ORDER — ESTRADIOL 2 MG PO TABS: 2.0000 mg | ORAL_TABLET | Freq: Every day | ORAL | 3 refills | Status: DC

## 2021-06-12 MED ORDER — OXYBUTYNIN CHLORIDE 5 MG PO TABS: ORAL_TABLET | ORAL | 2 refills | Status: DC

## 2021-06-12 MED ORDER — TAMSULOSIN HCL 0.4 MG PO CAPS: 0.4000 mg | ORAL_CAPSULE | Freq: Every day | ORAL | 6 refills | Status: DC

## 2021-06-12 NOTE — Patient Instructions (Addendum)
I value your feedback and you entrusting us with your care. If you get a [facility] patient survey, I would appreciate you taking the time to let us know about your experience today. Thank you!  [facility]: [phone]  [facility]: [phone]    

## 2021-06-12 NOTE — Progress Notes (Signed)
06/12/2021 2:49 PM   Patricia Love 13-Jan-1966 [id number]  Referring provider: Idelle Games, MD [facility] [place],  [place] [address]  Chief Complaint  Patient presents with   Nephrolithiasis    HPI: 55 y.o. female presents for annual follow-up.  Refer to my prior note 06/13/2020 RUS ordered by Dr. Doy Hutching and performed 02/11/2021 showed renal atrophy and bilateral, nonobstructing renal calculi.  Largest 7 mm on right and 4 mm on left Notes occasional right lower quadrant pain radiating to the groin region and associated with frequency, urgency.  Both tamsulosin and oxybutynin helped the symptoms Has had recent tiredness, fatigue and has seen cardiology at [facility] Was also referred to nephrology for evaluation of chronic kidney disease   PMH: Past Medical History:  Diagnosis Date   Anxiety    Basal cell carcinoma    Depression    Dysplastic nevus 05/09/2020   Right mid back paraspinal. Moderate atypia, close to margin.   Dysplastic nevus 05/09/2020   Right mid to upper back 5cm lat. to spine. Moderate atypia, close to margin.   Endometriosis    Epstein Barr virus infection    Hyperlipidemia    Hypertension    Kidney stones    Migraine    Neuropathy    left breast after reduction mammoplasty, seen at pain clinic    Surgical History: Past Surgical History:  Procedure Laterality Date   ABDOMINAL HYSTERECTOMY     BASAL CELL CARCINOMA EXCISION  2014   right wrist   DIAGNOSTIC LAPAROSCOPY  [id number]   OSIS patent tubes   ELBOW SURGERY     kidney stone removal     LITHOTRIPSY     x4; 05/2016   REDUCTION MAMMAPLASTY  2012   reduction mammoplasty  2012   Dr. Tula Nakayama   TOTAL ABDOMINAL HYSTERECTOMY W/ BILATERAL SALPINGOOPHORECTOMY  2005   adenomyosis and severe endometriosis. Dr Rayford Halsted    Home Medications:  Allergies as of 06/12/2021       Reactions   Contrast Media  [iodinated Diagnostic Agents] Anaphylaxis   Meloxicam Swelling    Pregabalin Other (See Comments)   Atorvastatin Other (See Comments)   Elevated cholesterol   Butorphanol Tartrate Other (See Comments)   Patient states she is not allergic to this   Chlorphen-diphenhyd-pe-apap    Other reaction(s): Other (See Comments)   Gabapentin    Gemfibrozil Other (See Comments)   Verapamil Other (See Comments)   Codeine Sulfate Nausea Only        Medication List        Accurate as of June 12, 2021  2:49 PM. If you have any questions, ask your nurse or doctor.          STOP taking these medications    ciprofloxacin 500 MG tablet Commonly known as: CIPRO Stopped by: Elmo Putt Copland, PA-C   clotrimazole-betamethasone cream Commonly known as: Lotrisone Stopped by: Elmo Putt Copland, PA-C   Eucrisa 2 % Oint Generic drug: Crisaborole Stopped by: Elmo Putt Copland, PA-C   tamsulosin 0.4 MG Caps capsule Commonly known as: FLOMAX Stopped by: Elmo Putt Copland, PA-C       TAKE these medications    ALPRAZolam 0.5 MG tablet Commonly known as: XANAX Take by mouth. What changed: Another medication with the same name was removed. Continue taking this medication, and follow the directions you see here. Changed by: Elmo Putt Copland, PA-C   amitriptyline 25 MG tablet Commonly known as: ELAVIL Take 1 tablet by mouth daily.  atenolol 50 MG tablet Commonly known as: TENORMIN Take 1 tablet by mouth daily.   buPROPion 150 MG 12 hr tablet Commonly known as: WELLBUTRIN SR Take 150 mg by mouth 2 (two) times daily.   butalbital-acetaminophen-caffeine 50-325-40 MG tablet Commonly known as: FIORICET Take 2 tablets by mouth every 4 (four) hours as needed.   cyanocobalamin 1000 MCG tablet Take by mouth.   DULoxetine 30 MG capsule Commonly known as: CYMBALTA Take 1 capsule by mouth daily.   estradiol 2 MG tablet Commonly known as: ESTRACE Take 1 tablet (2 mg total) by mouth daily.   ezetimibe 10 MG tablet Commonly known as: ZETIA Take 1 tablet by mouth  daily.   folic acid 1 MG tablet Commonly known as: FOLVITE Take by mouth.   furosemide 40 MG tablet Commonly known as: LASIX Take 1.5 tablets by mouth daily.   HYDROcodone-acetaminophen 10-325 MG tablet Commonly known as: NORCO Take 1 tablet by mouth every 6 (six) hours as needed.   oxybutynin 5 MG tablet Commonly known as: DITROPAN TAKE 1 TABLET(5 MG) BY MOUTH EVERY 8 HOURS AS NEEDED FOR BLADDER SPASMS   potassium chloride SA 20 MEQ tablet Commonly known as: KLOR-CON Take 4 tablets by mouth every morning.   potassium citrate 10 MEQ (1080 MG) SR tablet Commonly known as: UROCIT-K TAKE 2 TABLETS BY MOUTH IN  THE MORNING   promethazine 25 MG tablet Commonly known as: PHENERGAN Take 1 tablet by mouth every 6 (six) hours as needed.   rosuvastatin 20 MG tablet Commonly known as: CRESTOR Take 1 tablet by mouth daily.   SUMAtriptan 100 MG tablet Commonly known as: IMITREX Take 1 tablet by mouth daily as needed.   topiramate 25 MG tablet Commonly known as: TOPAMAX Take 3 tablets by mouth 2 (two) times daily.   Vitamin D (Ergocalciferol) 1.25 MG (50000 UNIT) Caps capsule Commonly known as: DRISDOL Take by mouth.        Allergies:  Allergies  Allergen Reactions   Contrast Media  [Iodinated Diagnostic Agents] Anaphylaxis   Meloxicam Swelling   Pregabalin Other (See Comments)   Atorvastatin Other (See Comments)    Elevated cholesterol   Butorphanol Tartrate Other (See Comments)    Patient states she is not allergic to this   Chlorphen-Diphenhyd-Pe-Apap     Other reaction(s): Other (See Comments)   Gabapentin    Gemfibrozil Other (See Comments)   Verapamil Other (See Comments)   Codeine Sulfate Nausea Only    Family History: Family History  Problem Relation Age of Onset   Hyperlipidemia Mother    Hypertension Mother    Depression Mother    Stroke Mother    Hypertension Father    Diabetes Father    Hyperlipidemia Sister    Hypertension Sister     Depression Sister    Ovarian cancer Maternal Grandmother 81   Breast cancer Neg Hx     Social History:  reports that she has never smoked. She has never used smokeless tobacco. She reports that she does not drink alcohol and does not use drugs.   Physical Exam: BP 102/63   Pulse (!) 101   Ht 5' (1.524 m)   Wt 101 lb (45.8 kg)   LMP  (LMP Unknown)   BMI 19.73 kg/m   Constitutional:  Alert and oriented, No acute distress. HEENT: [place] AT, moist mucus membranes.  Trachea midline, no masses. Cardiovascular: No clubbing, cyanosis, or edema. Respiratory: Normal respiratory effort, no increased work of breathing. Skin: No rashes,  bruises or suspicious lesions. Neurologic: Grossly intact, no focal deficits, moving all 4 extremities. Psychiatric: Normal mood and affect.   Pertinent Imaging: KUB images performed they were personally reviewed and interpreted.  Stable right renal calculi when compared with a KUB 06/13/2020.  No left-sided calculi are identified   Assessment & Plan:    1.  Nephrolithiasis Nonobstructing renal calculi Recommend continued surveillance KUB 6 months and will call with results Follow-up 1 year office visit with KUB Call earlier for recurrent flank/abdominal pain Tamsulosin and oxybutynin were refilled   Abbie Sons, MD  [facility] [address], [place], [place] [address] [phone]

## 2021-06-13 ENCOUNTER — Encounter: Payer: Self-pay | Admitting: Urology

## 2021-06-18 ENCOUNTER — Other Ambulatory Visit: Payer: Self-pay

## 2021-06-18 ENCOUNTER — Ambulatory Visit (INDEPENDENT_AMBULATORY_CARE_PROVIDER_SITE_OTHER): Payer: Self-pay | Admitting: Dermatology

## 2021-06-18 DIAGNOSIS — L988 Other specified disorders of the skin and subcutaneous tissue: Secondary | ICD-10-CM

## 2021-06-18 NOTE — Patient Instructions (Signed)

## 2021-06-18 NOTE — Progress Notes (Signed)
   Follow-Up Visit   Subjective  Patricia Love is a 55 y.o. female who presents for the following: Facial Elastosis (Patient is here today for botox follow up. She would like to get same as she normally gets. ).  The following portions of the chart were reviewed this encounter and updated as appropriate:  Tobacco  Allergies  Meds  Problems  Med Hx  Surg Hx  Fam Hx      Objective  Well appearing patient in no apparent distress; mood and affect are within normal limits.  A focused examination was performed including face. Relevant physical exam findings are noted in the Assessment and Plan.  face Rhytides and volume loss.      Assessment & Plan  Elastosis of skin face    Botox Injection - face Location: Botox 25 units injected into - The frown complex  Informed consent: Discussed risks (infection, pain, bleeding, bruising, swelling, allergic reaction, paralysis of nearby muscles, eyelid droop, double vision, neck weakness, difficulty breathing, headache, undesirable cosmetic result, and need for additional treatment) and benefits of the procedure, as well as the alternatives.  Informed consent was obtained.  Preparation: The area was cleansed with alcohol.  Procedure Details:  Botox was injected into the dermis with a 30-gauge needle. Pressure applied to any bleeding. Ice packs offered for swelling.  Lot Number:  [id number] Expiration:  04/2023  Total Units Injected:  25  Plan: Patient was instructed to remain upright for 4 hours. Patient was instructed to avoid massaging the face and avoid vigorous exercise for the rest of the day. Tylenol may be used for headache.  Allow 2 weeks before returning to clinic for additional dosing as needed. Patient will call for any problems.   Return in about 1 year (around 06/18/2022) for tbse. IRuthell Rummage, CMA, am acting as scribe for Sarina Ser, MD. Documentation: I have reviewed the above documentation for accuracy and  completeness, and I agree with the above.  Sarina Ser, MD

## 2021-06-19 ENCOUNTER — Encounter: Payer: Self-pay | Admitting: Dermatology

## 2021-06-24 ENCOUNTER — Telehealth: Payer: Self-pay

## 2021-06-24 NOTE — Telephone Encounter (Signed)
Pt calling; was seen for annual not long ago; was going to do a urine specimen b/c she thought she was at the beginnings of a yeast inf; now she believes she does have a yeast inf.  [phone]  Pt states she didn't do u/a b/c of new ins.  Sxs are white, itchy d/c.  Is allergic to monistat.

## 2021-06-25 ENCOUNTER — Other Ambulatory Visit: Payer: Self-pay | Admitting: Obstetrics and Gynecology

## 2021-06-25 MED ORDER — FLUCONAZOLE 150 MG PO TABS: 150.0000 mg | ORAL_TABLET | Freq: Once | ORAL | 0 refills | Status: AC

## 2021-06-25 NOTE — Telephone Encounter (Signed)
Rx diflucan eRxd. F/u if sx persist. Thx.

## 2021-06-25 NOTE — Progress Notes (Signed)
Rx diflucan for yeast vag sx.  

## 2021-06-25 NOTE — Telephone Encounter (Signed)
Pt aware.

## 2021-06-26 ENCOUNTER — Other Ambulatory Visit: Payer: Self-pay

## 2021-06-26 ENCOUNTER — Ambulatory Visit
Admission: RE | Admit: 2021-06-26 | Discharge: 2021-06-26 | Disposition: A | Discharge status: Home / Self Care | Payer: 59 | Source: Ambulatory Visit | Attending: Obstetrics and Gynecology | Admitting: Obstetrics and Gynecology

## 2021-06-26 DIAGNOSIS — Z1231 Encounter for screening mammogram for malignant neoplasm of breast: Secondary | ICD-10-CM | POA: Insufficient documentation

## 2021-08-06 ENCOUNTER — Other Ambulatory Visit: Payer: Self-pay | Admitting: Obstetrics and Gynecology

## 2021-08-06 DIAGNOSIS — N951 Menopausal and female climacteric states: Secondary | ICD-10-CM

## 2021-08-06 MED ORDER — ESTRADIOL 2 MG PO TABS: 2.0000 mg | ORAL_TABLET | Freq: Every day | ORAL | 3 refills | Status: DC

## 2021-08-06 NOTE — Progress Notes (Signed)
Rx RF to [facility], was sent to [facility] 8/22

## 2021-08-30 ENCOUNTER — Telehealth: Payer: Self-pay

## 2021-08-30 DIAGNOSIS — N951 Menopausal and female climacteric states: Secondary | ICD-10-CM

## 2021-08-30 MED ORDER — ESTRADIOL 2 MG PO TABS: 2.0000 mg | ORAL_TABLET | Freq: Every day | ORAL | 3 refills | Status: DC

## 2021-08-30 NOTE — Telephone Encounter (Signed)
Pt calling; saw ABC in Aug; has switched ins; needs new rx for estradiol sent to [facility] in [place].  [phone] pt aware refill eRx'd to [facility].

## 2021-09-21 ENCOUNTER — Other Ambulatory Visit: Payer: Self-pay | Admitting: Urology

## 2021-10-16 ENCOUNTER — Ambulatory Visit: Payer: 59 | Admitting: Dermatology

## 2021-10-28 ENCOUNTER — Other Ambulatory Visit: Payer: Self-pay | Admitting: Urology

## 2021-10-29 ENCOUNTER — Other Ambulatory Visit: Payer: Self-pay | Admitting: Internal Medicine

## 2021-10-29 DIAGNOSIS — R401 Stupor: Secondary | ICD-10-CM

## 2021-10-29 DIAGNOSIS — I10 Essential (primary) hypertension: Secondary | ICD-10-CM

## 2021-11-13 ENCOUNTER — Ambulatory Visit: Admission: RE | Admit: 2021-11-13 | Payer: 59 | Source: Ambulatory Visit

## 2021-11-26 ENCOUNTER — Ambulatory Visit: Payer: Self-pay | Admitting: Dermatology

## 2021-11-27 ENCOUNTER — Ambulatory Visit: Payer: 59

## 2021-11-28 ENCOUNTER — Ambulatory Visit
Admission: RE | Admit: 2021-11-28 | Discharge: 2021-11-28 | Disposition: A | Discharge status: Home / Self Care | Payer: 59 | Source: Ambulatory Visit | Attending: Internal Medicine | Admitting: Internal Medicine

## 2021-11-28 ENCOUNTER — Other Ambulatory Visit: Payer: Self-pay

## 2021-11-28 DIAGNOSIS — R401 Stupor: Secondary | ICD-10-CM | POA: Diagnosis not present

## 2021-11-28 DIAGNOSIS — I10 Essential (primary) hypertension: Secondary | ICD-10-CM | POA: Insufficient documentation

## 2021-11-28 DIAGNOSIS — R262 Difficulty in walking, not elsewhere classified: Secondary | ICD-10-CM | POA: Diagnosis not present

## 2021-11-28 DIAGNOSIS — R410 Disorientation, unspecified: Secondary | ICD-10-CM | POA: Diagnosis not present

## 2021-11-28 DIAGNOSIS — G9389 Other specified disorders of brain: Secondary | ICD-10-CM | POA: Diagnosis not present

## 2021-12-06 DIAGNOSIS — E559 Vitamin D deficiency, unspecified: Secondary | ICD-10-CM | POA: Diagnosis not present

## 2021-12-06 DIAGNOSIS — Z79891 Long term (current) use of opiate analgesic: Secondary | ICD-10-CM | POA: Diagnosis not present

## 2021-12-06 DIAGNOSIS — M797 Fibromyalgia: Secondary | ICD-10-CM | POA: Diagnosis not present

## 2021-12-06 DIAGNOSIS — R202 Paresthesia of skin: Secondary | ICD-10-CM | POA: Diagnosis not present

## 2021-12-06 DIAGNOSIS — R4189 Other symptoms and signs involving cognitive functions and awareness: Secondary | ICD-10-CM | POA: Diagnosis not present

## 2021-12-12 DIAGNOSIS — R69 Illness, unspecified: Secondary | ICD-10-CM | POA: Diagnosis not present

## 2021-12-12 DIAGNOSIS — R7309 Other abnormal glucose: Secondary | ICD-10-CM | POA: Diagnosis not present

## 2021-12-12 DIAGNOSIS — I10 Essential (primary) hypertension: Secondary | ICD-10-CM | POA: Diagnosis not present

## 2021-12-12 DIAGNOSIS — E782 Mixed hyperlipidemia: Secondary | ICD-10-CM | POA: Diagnosis not present

## 2022-01-24 ENCOUNTER — Other Ambulatory Visit: Payer: Self-pay | Admitting: *Deleted

## 2022-01-24 MED ORDER — TAMSULOSIN HCL 0.4 MG PO CAPS: 0.4000 mg | ORAL_CAPSULE | Freq: Every day | ORAL | 6 refills | Status: DC

## 2022-01-28 DIAGNOSIS — R809 Proteinuria, unspecified: Secondary | ICD-10-CM | POA: Diagnosis not present

## 2022-01-28 DIAGNOSIS — N1831 Chronic kidney disease, stage 3a: Secondary | ICD-10-CM | POA: Diagnosis not present

## 2022-01-28 DIAGNOSIS — R768 Other specified abnormal immunological findings in serum: Secondary | ICD-10-CM | POA: Diagnosis not present

## 2022-01-28 DIAGNOSIS — N200 Calculus of kidney: Secondary | ICD-10-CM | POA: Diagnosis not present

## 2022-01-30 ENCOUNTER — Ambulatory Visit: Payer: 59 | Admitting: Dermatology

## 2022-01-30 ENCOUNTER — Other Ambulatory Visit: Payer: Self-pay

## 2022-01-30 DIAGNOSIS — D1800 Hemangioma unspecified site: Secondary | ICD-10-CM | POA: Diagnosis not present

## 2022-01-30 DIAGNOSIS — L821 Other seborrheic keratosis: Secondary | ICD-10-CM | POA: Diagnosis not present

## 2022-01-30 DIAGNOSIS — L814 Other melanin hyperpigmentation: Secondary | ICD-10-CM

## 2022-01-30 DIAGNOSIS — L578 Other skin changes due to chronic exposure to nonionizing radiation: Secondary | ICD-10-CM

## 2022-01-30 DIAGNOSIS — Z1283 Encounter for screening for malignant neoplasm of skin: Secondary | ICD-10-CM

## 2022-01-30 DIAGNOSIS — Z86018 Personal history of other benign neoplasm: Secondary | ICD-10-CM | POA: Diagnosis not present

## 2022-01-30 DIAGNOSIS — D225 Melanocytic nevi of trunk: Secondary | ICD-10-CM

## 2022-01-30 DIAGNOSIS — L988 Other specified disorders of the skin and subcutaneous tissue: Secondary | ICD-10-CM

## 2022-01-30 DIAGNOSIS — D492 Neoplasm of unspecified behavior of bone, soft tissue, and skin: Secondary | ICD-10-CM

## 2022-01-30 DIAGNOSIS — D229 Melanocytic nevi, unspecified: Secondary | ICD-10-CM | POA: Diagnosis not present

## 2022-01-30 DIAGNOSIS — Z85828 Personal history of other malignant neoplasm of skin: Secondary | ICD-10-CM

## 2022-01-30 NOTE — Progress Notes (Signed)
? ?Follow-Up Visit ?  ?Subjective  ?Patricia Love is a 56 y.o. female who presents for the following: Annual Exam (Mole check ). Hx of Dysplastic nevus.  ?The patient presents for Total-Body Skin Exam (TBSE) for skin cancer screening and mole check.  The patient has spots, moles and lesions to be evaluated, some may be new or changing and the patient has concerns that these could be cancer.  ?Pt would like Botox today.  ? ?The following portions of the chart were reviewed this encounter and updated as appropriate:  ? Tobacco  Allergies  Meds  Problems  Med Hx  Surg Hx  Fam Hx   ?  ?Review of Systems:  No other skin or systemic complaints except as noted in HPI or Assessment and Plan. ? ?Objective  ?Well appearing patient in no apparent distress; mood and affect are within normal limits. ? ?A full examination was performed including scalp, head, eyes, ears, nose, lips, neck, chest, axillae, abdomen, back, buttocks, bilateral upper extremities, bilateral lower extremities, hands, feet, fingers, toes, fingernails, and toenails. All findings within normal limits unless otherwise noted below. ? ?left upper back paraspinal ?0.6 cm irregular brown macule  ? ? ? ? ? ? ?left mid to low back paraspinal ?0.6 cm irregular brown macule  ? ? ? ? ? ? ?right upper quad abdomen ?#2 ~0.3 cm med brown macules  ? ? ? ? ? ? ?Head - Anterior (Face) ?Rhytides and volume loss.  ? ? ? ? ? ?Assessment & Plan  ?Neoplasm of skin (2) ?left upper back paraspinal ?Epidermal / dermal shaving ? ?Lesion diameter (cm):  0.6 ?Informed consent: discussed and consent obtained   ?Timeout: patient name, date of birth, surgical site, and procedure verified   ?Patient was prepped and draped in usual sterile fashion: area prepped with alcohol. ?Anesthesia: the lesion was anesthetized in a standard fashion   ?Anesthetic:  1% lidocaine w/ epinephrine 1-100,000 local infiltration ?Instrument used: flexible razor blade   ?Hemostasis achieved with:  pressure, aluminum chloride and electrodesiccation   ?Outcome: patient tolerated procedure well   ?Post-procedure details: wound care instructions given   ?Post-procedure details comment:  Ointment and a small bandage applied ? ?Specimen 1 - Surgical pathology ?Differential Diagnosis: R/O Dysplastic nevus vs other  ?Check Margins: No ? ?left mid to low back paraspinal ?Epidermal / dermal shaving ? ?Lesion diameter (cm):  0.6 ?Informed consent: discussed and consent obtained   ?Timeout: patient name, date of birth, surgical site, and procedure verified   ?Patient was prepped and draped in usual sterile fashion: area prepped with alcohol. ?Anesthesia: the lesion was anesthetized in a standard fashion   ?Anesthetic:  1% lidocaine w/ epinephrine 1-100,000 local infiltration ?Instrument used: flexible razor blade   ?Hemostasis achieved with: pressure, aluminum chloride and electrodesiccation   ?Outcome: patient tolerated procedure well   ?Post-procedure details: wound care instructions given   ?Post-procedure details comment:  Ointment and a small bandage applied ? ?Specimen 2 - Surgical pathology ?Differential Diagnosis: R/O Dysplastic nevus vs other  ?Check Margins: No ? ?Nevus -slightly irregular ?right upper quad abdomen ?See photo ?Recheck next visit.  Consider removing if change or may consider removing if other nevi removed today are dysplastic. ?   ?Elastosis of skin ?Head - Anterior (Face) ?Intralesional injection - Head - Anterior (Face) ?Location: See attached image ? ?Informed consent: Discussed risks (infection, pain, bleeding, bruising, swelling, allergic reaction, paralysis of nearby muscles, eyelid droop, double vision, neck weakness, difficulty breathing, headache,  undesirable cosmetic result, and need for additional treatment) and benefits of the procedure, as well as the alternatives.  Informed consent was obtained. ? ?Preparation: The area was cleansed with alcohol. ? ?Procedure Details:  Botox was  injected into the dermis with a 30-gauge needle. Pressure applied to any bleeding. Ice packs offered for swelling. ? ?Lot Number:  [id number] ?Expiration:  01/2024 ? ?Total Units Injected:  25 ? ?Plan: Patient was instructed to remain upright for 4 hours. Patient was instructed to avoid massaging the face and avoid vigorous exercise for the rest of the day. Tylenol may be used for headache.  Allow 2 weeks before returning to clinic for additional dosing as needed. Patient will call for any problems. ? ?Lentigines ?- Scattered tan macules ?- Due to sun exposure ?- Benign-appearing, observe ?- Recommend daily broad spectrum sunscreen SPF 30+ to sun-exposed areas, reapply every 2 hours as needed. ?- Call for any changes ? ?Seborrheic Keratoses ?- Stuck-on, waxy, tan-brown papules and/or plaques  ?- Benign-appearing ?- Discussed benign etiology and prognosis. ?- Observe ?- Call for any changes ? ?Melanocytic Nevi ?- Tan-brown and/or pink-flesh-colored symmetric macules and papules ?- Benign appearing on exam today ?- Observation ?- Call clinic for new or changing moles ?- Recommend daily use of broad spectrum spf 30+ sunscreen to sun-exposed areas.  ? ?Hemangiomas ?- Red papules ?- Discussed benign nature ?- Observe ?- Call for any changes ? ?Actinic Damage ?- Chronic condition, secondary to cumulative UV/sun exposure ?- diffuse scaly erythematous macules with underlying dyspigmentation ?- Recommend daily broad spectrum sunscreen SPF 30+ to sun-exposed areas, reapply every 2 hours as needed.  ?- Staying in the shade or wearing long sleeves, sun glasses (UVA+UVB protection) and wide brim hats (4-inch brim around the entire circumference of the hat) are also recommended for sun protection.  ?- Call for new or changing lesions. ? ?History of Dysplastic Nevi ?- No evidence of recurrence today ?- Recommend regular full body skin exams ?- Recommend daily broad spectrum sunscreen SPF 30+ to sun-exposed areas, reapply every 2  hours as needed.  ?- Call if any new or changing lesions are noted between office visits  ? ?History of Basal Cell Carcinoma of the Skin ?Right wrist- unknown  ?- No evidence of recurrence today ?- Recommend regular full body skin exams ?- Recommend daily broad spectrum sunscreen SPF 30+ to sun-exposed areas, reapply every 2 hours as needed.  ?- Call if any new or changing lesions are noted between office visits  ? ?Skin cancer screening performed today.  ? ?Return in about 1 year (around 01/31/2023) for TBSE, hx of BCC, hx of . ? ?I, Marye Round, CMA, am acting as scribe for Sarina Ser, MD .  ?Documentation: I have reviewed the above documentation for accuracy and completeness, and I agree with the above. ? ?Sarina Ser, MD ? ?

## 2022-01-30 NOTE — Patient Instructions (Signed)

## 2022-02-03 ENCOUNTER — Encounter: Payer: Self-pay | Admitting: Dermatology

## 2022-02-03 DIAGNOSIS — R7309 Other abnormal glucose: Secondary | ICD-10-CM | POA: Diagnosis not present

## 2022-02-03 DIAGNOSIS — Z79899 Other long term (current) drug therapy: Secondary | ICD-10-CM | POA: Diagnosis not present

## 2022-02-03 DIAGNOSIS — E782 Mixed hyperlipidemia: Secondary | ICD-10-CM | POA: Diagnosis not present

## 2022-02-04 ENCOUNTER — Telehealth: Payer: Self-pay

## 2022-02-04 NOTE — Telephone Encounter (Signed)
Patient informed of pathology results 

## 2022-02-04 NOTE — Telephone Encounter (Signed)
-----   Message from Ralene Bathe, MD sent at 02/02/2022  1:47 PM EDT ----- ?Diagnosis ?1. Skin , left upper back paraspinal ?DYSPLASTIC COMPOUND NEVUS WITH MILD ATYPIA, DEEP MARGIN INVOLVED ?2. Skin , left mid to low back paraspinal ?DYSPLASTIC COMPOUND NEVUS WITH MILD ATYPIA, CLOSE TO MARGIN ? ?1&2 - both Mild dysplastic ?Recheck next visit ?

## 2022-02-19 DIAGNOSIS — I10 Essential (primary) hypertension: Secondary | ICD-10-CM | POA: Diagnosis not present

## 2022-02-19 DIAGNOSIS — R7309 Other abnormal glucose: Secondary | ICD-10-CM | POA: Diagnosis not present

## 2022-02-19 DIAGNOSIS — E781 Pure hyperglyceridemia: Secondary | ICD-10-CM | POA: Diagnosis not present

## 2022-02-19 DIAGNOSIS — E782 Mixed hyperlipidemia: Secondary | ICD-10-CM | POA: Diagnosis not present

## 2022-05-20 DIAGNOSIS — N1831 Chronic kidney disease, stage 3a: Secondary | ICD-10-CM | POA: Diagnosis not present

## 2022-06-09 DIAGNOSIS — N1831 Chronic kidney disease, stage 3a: Secondary | ICD-10-CM | POA: Diagnosis not present

## 2022-06-09 DIAGNOSIS — N200 Calculus of kidney: Secondary | ICD-10-CM | POA: Diagnosis not present

## 2022-06-11 ENCOUNTER — Other Ambulatory Visit: Payer: Self-pay | Admitting: Obstetrics and Gynecology

## 2022-06-11 ENCOUNTER — Other Ambulatory Visit: Payer: Self-pay | Admitting: Internal Medicine

## 2022-06-11 DIAGNOSIS — Z1231 Encounter for screening mammogram for malignant neoplasm of breast: Secondary | ICD-10-CM

## 2022-06-12 ENCOUNTER — Ambulatory Visit: Payer: Self-pay | Admitting: Urology

## 2022-06-13 ENCOUNTER — Ambulatory Visit: Payer: 59 | Admitting: Urology

## 2022-06-13 ENCOUNTER — Ambulatory Visit
Admission: RE | Admit: 2022-06-13 | Discharge: 2022-06-13 | Disposition: A | Discharge status: Home / Self Care | Payer: 59 | Source: Ambulatory Visit | Attending: Urology | Admitting: Urology

## 2022-06-13 ENCOUNTER — Other Ambulatory Visit: Payer: Self-pay | Admitting: Family Medicine

## 2022-06-13 ENCOUNTER — Ambulatory Visit
Admission: RE | Admit: 2022-06-13 | Discharge: 2022-06-13 | Disposition: A | Discharge status: Home / Self Care | Payer: 59 | Attending: Urology | Admitting: Urology

## 2022-06-13 ENCOUNTER — Encounter: Payer: Self-pay | Admitting: Urology

## 2022-06-13 DIAGNOSIS — N200 Calculus of kidney: Secondary | ICD-10-CM | POA: Insufficient documentation

## 2022-06-13 MED ORDER — TAMSULOSIN HCL 0.4 MG PO CAPS: 0.4000 mg | ORAL_CAPSULE | Freq: Every day | ORAL | 6 refills | Status: DC

## 2022-06-13 MED ORDER — POTASSIUM CITRATE ER 10 MEQ (1080 MG) PO TBCR: EXTENDED_RELEASE_TABLET | ORAL | 3 refills | Status: DC

## 2022-06-13 NOTE — Progress Notes (Signed)
06/13/2022 11:04 AM   Patricia Love [birth year] [id number]  Referring provider: Idelle Hayashi, MD [place] [facility] [place],  [place] [address]  Chief Complaint  Patient presents with   Nephrolithiasis    HPI: 56 y.o. female presents for annual follow-up.  States nephrology has decreased her Lasix dose and she has had noted slowing of her urinary stream which has improved taking tamsulosin regularly Remains on potassium citrate Notes occasional right groin pain that improves after taking oxybutynin Denies dysuria, gross hematuria Denies flank, abdominal or pelvic pain   PMH: Past Medical History:  Diagnosis Date   Anxiety    Basal cell carcinoma    Depression    Dysplastic nevus 05/09/2020   Right mid back paraspinal. Moderate atypia, close to margin.   Dysplastic nevus 05/09/2020   Right mid to upper back 5cm lat. to spine. Moderate atypia, close to margin.   Dysplastic nevus 01/30/2022   L upper back paraspinal - mild   Dysplastic nevus 01/30/2022   L mid to low back paraspinal - mild   Endometriosis    Epstein Barr virus infection    Hyperlipidemia    Hypertension    Kidney stones    Migraine    Neuropathy    left breast after reduction mammoplasty, seen at pain clinic    Surgical History: Past Surgical History:  Procedure Laterality Date   ABDOMINAL HYSTERECTOMY     BASAL CELL CARCINOMA EXCISION  2014   right wrist   DIAGNOSTIC LAPAROSCOPY  [id number]   OSIS patent tubes   ELBOW SURGERY     kidney stone removal     LITHOTRIPSY     x4; 05/2016   REDUCTION MAMMAPLASTY  2012   reduction mammoplasty  2012   Dr. Tula Nakayama   TOTAL ABDOMINAL HYSTERECTOMY W/ BILATERAL SALPINGOOPHORECTOMY  2005   adenomyosis and severe endometriosis. Dr Rayford Halsted    Home Medications:  Allergies as of 06/13/2022       Reactions   Contrast Media  [iodinated Contrast Media] Anaphylaxis   Meloxicam Swelling   Pregabalin Other (See Comments)    Atorvastatin Other (See Comments)   Elevated cholesterol   Butorphanol Tartrate Other (See Comments)   Patient states she is not allergic to this   Chlorphen-diphenhyd-pe-apap    Other reaction(s): Other (See Comments)   Gabapentin    Gemfibrozil Other (See Comments)   Verapamil Other (See Comments)   Codeine Sulfate Nausea Only        Medication List        Accurate as of June 13, 2022 11:04 AM. If you have any questions, ask your nurse or doctor.          ALPRAZolam 0.5 MG tablet Commonly known as: XANAX Take by mouth.   amitriptyline 25 MG tablet Commonly known as: ELAVIL Take 1 tablet by mouth daily.   atenolol 50 MG tablet Commonly known as: TENORMIN Take 25 mg by mouth daily. Patient reports taking 1/2 tablet daily   buPROPion 150 MG 12 hr tablet Commonly known as: WELLBUTRIN SR Take 150 mg by mouth 2 (two) times daily.   butalbital-acetaminophen-caffeine 50-325-40 MG tablet Commonly known as: FIORICET Take 2 tablets by mouth every 4 (four) hours as needed.   cyanocobalamin 1000 MCG tablet Take by mouth.   DULoxetine 30 MG capsule Commonly known as: CYMBALTA Take 1 capsule by mouth daily.   estradiol 2 MG tablet Commonly known as: ESTRACE Take 1 tablet (2 mg total) by  mouth daily.   ezetimibe 10 MG tablet Commonly known as: ZETIA Take 1 tablet by mouth daily.   folic acid 1 MG tablet Commonly known as: FOLVITE Take by mouth.   furosemide 40 MG tablet Commonly known as: LASIX Take 1.5 tablets by mouth daily.   HYDROcodone-acetaminophen 10-325 MG tablet Commonly known as: NORCO Take 1 tablet by mouth every 6 (six) hours as needed.   oxybutynin 5 MG tablet Commonly known as: DITROPAN TAKE 1 TABLET(5 MG) BY MOUTH EVERY 8 HOURS AS NEEDED FOR BLADDER SPASMS   potassium chloride SA 20 MEQ tablet Commonly known as: KLOR-CON M Take 4 tablets by mouth every morning.   potassium citrate 10 MEQ (1080 MG) SR tablet Commonly known as:  UROCIT-K TAKE 2 TABLETS BY MOUTH IN  THE MORNING   promethazine 25 MG tablet Commonly known as: PHENERGAN Take 1 tablet by mouth every 6 (six) hours as needed.   rosuvastatin 20 MG tablet Commonly known as: CRESTOR Take 1 tablet by mouth daily.   SUMAtriptan 100 MG tablet Commonly known as: IMITREX Take 1 tablet by mouth daily as needed.   tamsulosin 0.4 MG Caps capsule Commonly known as: FLOMAX Take 1 capsule (0.4 mg total) by mouth daily.   topiramate 25 MG tablet Commonly known as: TOPAMAX Take 3 tablets by mouth 2 (two) times daily.   Vitamin D (Ergocalciferol) 1.25 MG (50000 UNIT) Caps capsule Commonly known as: DRISDOL Take by mouth.        Allergies:  Allergies  Allergen Reactions   Contrast Media  [Iodinated Contrast Media] Anaphylaxis   Meloxicam Swelling   Pregabalin Other (See Comments)   Atorvastatin Other (See Comments)    Elevated cholesterol   Butorphanol Tartrate Other (See Comments)    Patient states she is not allergic to this   Chlorphen-Diphenhyd-Pe-Apap     Other reaction(s): Other (See Comments)   Gabapentin    Gemfibrozil Other (See Comments)   Verapamil Other (See Comments)   Codeine Sulfate Nausea Only    Family History: Family History  Problem Relation Age of Onset   Hyperlipidemia Mother    Hypertension Mother    Depression Mother    Stroke Mother    Hypertension Father    Diabetes Father    Hyperlipidemia Sister    Hypertension Sister    Depression Sister    Ovarian cancer Maternal Grandmother 17   Breast cancer Neg Hx     Social History:  reports that she has never smoked. She has never used smokeless tobacco. She reports that she does not drink alcohol and does not use drugs.   Physical Exam: BP 113/79   Pulse (!) 102   Ht '5\' 5"'$  (1.651 m)   Wt 100 lb (45.4 kg)   LMP  (LMP Unknown)   BMI 16.64 kg/m   Constitutional:  Alert and oriented, No acute distress. HEENT: [place] AT Respiratory: Normal respiratory effort, no  increased work of breathing. Psychiatric: Normal mood and affect.   Pertinent Imaging: KUB images performed today were personally reviewed and interpreted.  Stable right upper pole renal calculus.  The right lower pole calculus is not identified but stool and bowel gas are present obscuring the renal outlines.  No calcifications on the left or along the expected course of the ureter are noted   Assessment & Plan:    1.  Nephrolithiasis Nonobstructing renal calculi Recommend continued surveillance Follow-up 1 year office visit with KUB Call earlier for recurrent flank/abdominal pain Tamsulosin and potassium  citrate were refilled   Abbie Sons, MD  [facility] [address], [place], [place] [address] [phone]

## 2022-06-23 DIAGNOSIS — E782 Mixed hyperlipidemia: Secondary | ICD-10-CM | POA: Diagnosis not present

## 2022-06-23 DIAGNOSIS — E46 Unspecified protein-calorie malnutrition: Secondary | ICD-10-CM | POA: Diagnosis not present

## 2022-06-23 DIAGNOSIS — N1832 Chronic kidney disease, stage 3b: Secondary | ICD-10-CM | POA: Diagnosis not present

## 2022-06-23 DIAGNOSIS — F419 Anxiety disorder, unspecified: Secondary | ICD-10-CM | POA: Diagnosis not present

## 2022-06-23 DIAGNOSIS — Z1231 Encounter for screening mammogram for malignant neoplasm of breast: Secondary | ICD-10-CM | POA: Diagnosis not present

## 2022-06-23 DIAGNOSIS — R634 Abnormal weight loss: Secondary | ICD-10-CM | POA: Diagnosis not present

## 2022-06-23 DIAGNOSIS — I10 Essential (primary) hypertension: Secondary | ICD-10-CM | POA: Diagnosis not present

## 2022-06-23 DIAGNOSIS — Z0000 Encounter for general adult medical examination without abnormal findings: Secondary | ICD-10-CM | POA: Diagnosis not present

## 2022-06-23 DIAGNOSIS — R7309 Other abnormal glucose: Secondary | ICD-10-CM | POA: Diagnosis not present

## 2022-06-23 DIAGNOSIS — Z79899 Other long term (current) drug therapy: Secondary | ICD-10-CM | POA: Diagnosis not present

## 2022-06-30 ENCOUNTER — Ambulatory Visit
Admission: RE | Admit: 2022-06-30 | Discharge: 2022-06-30 | Disposition: A | Discharge status: Home / Self Care | Payer: 59 | Source: Ambulatory Visit | Attending: Obstetrics and Gynecology | Admitting: Obstetrics and Gynecology

## 2022-06-30 DIAGNOSIS — Z1231 Encounter for screening mammogram for malignant neoplasm of breast: Secondary | ICD-10-CM | POA: Diagnosis not present

## 2022-07-18 ENCOUNTER — Other Ambulatory Visit: Payer: Self-pay | Admitting: Internal Medicine

## 2022-07-18 DIAGNOSIS — R634 Abnormal weight loss: Secondary | ICD-10-CM

## 2022-07-18 DIAGNOSIS — E46 Unspecified protein-calorie malnutrition: Secondary | ICD-10-CM

## 2022-07-30 ENCOUNTER — Ambulatory Visit
Admission: RE | Admit: 2022-07-30 | Discharge: 2022-07-30 | Disposition: A | Discharge status: Home / Self Care | Payer: 59 | Source: Ambulatory Visit | Attending: Internal Medicine | Admitting: Internal Medicine

## 2022-07-30 DIAGNOSIS — K8020 Calculus of gallbladder without cholecystitis without obstruction: Secondary | ICD-10-CM | POA: Diagnosis not present

## 2022-07-30 DIAGNOSIS — R634 Abnormal weight loss: Secondary | ICD-10-CM

## 2022-07-30 DIAGNOSIS — E46 Unspecified protein-calorie malnutrition: Secondary | ICD-10-CM

## 2022-07-30 DIAGNOSIS — N200 Calculus of kidney: Secondary | ICD-10-CM | POA: Diagnosis not present

## 2022-08-01 DIAGNOSIS — H903 Sensorineural hearing loss, bilateral: Secondary | ICD-10-CM | POA: Diagnosis not present

## 2022-08-01 DIAGNOSIS — H6982 Other specified disorders of Eustachian tube, left ear: Secondary | ICD-10-CM | POA: Diagnosis not present

## 2022-08-06 ENCOUNTER — Other Ambulatory Visit: Payer: Self-pay | Admitting: Obstetrics and Gynecology

## 2022-08-06 DIAGNOSIS — N951 Menopausal and female climacteric states: Secondary | ICD-10-CM

## 2022-08-07 ENCOUNTER — Other Ambulatory Visit: Payer: Self-pay | Admitting: Obstetrics and Gynecology

## 2022-08-07 DIAGNOSIS — N951 Menopausal and female climacteric states: Secondary | ICD-10-CM

## 2022-09-06 ENCOUNTER — Other Ambulatory Visit: Payer: Self-pay | Admitting: Obstetrics and Gynecology

## 2022-09-06 DIAGNOSIS — N951 Menopausal and female climacteric states: Secondary | ICD-10-CM

## 2022-09-11 DIAGNOSIS — Z79899 Other long term (current) drug therapy: Secondary | ICD-10-CM | POA: Diagnosis not present

## 2022-09-11 DIAGNOSIS — M791 Myalgia, unspecified site: Secondary | ICD-10-CM | POA: Diagnosis not present

## 2022-09-11 DIAGNOSIS — M542 Cervicalgia: Secondary | ICD-10-CM | POA: Diagnosis not present

## 2022-09-11 DIAGNOSIS — N23 Unspecified renal colic: Secondary | ICD-10-CM | POA: Diagnosis not present

## 2022-09-11 DIAGNOSIS — G894 Chronic pain syndrome: Secondary | ICD-10-CM | POA: Diagnosis not present

## 2022-09-11 DIAGNOSIS — M792 Neuralgia and neuritis, unspecified: Secondary | ICD-10-CM | POA: Diagnosis not present

## 2022-09-11 DIAGNOSIS — K5900 Constipation, unspecified: Secondary | ICD-10-CM | POA: Diagnosis not present

## 2022-09-11 DIAGNOSIS — Z79891 Long term (current) use of opiate analgesic: Secondary | ICD-10-CM | POA: Diagnosis not present

## 2022-09-11 NOTE — Progress Notes (Signed)
PCP: Patricia Antonacci, MD   Chief Complaint  Patient presents with   Gynecologic Exam    No concerns    HPI:      [name] is a 56 y.o. G1P1001 who LMP was No LMP recorded (lmp unknown). Patient has had a hysterectomy., presents today for her annual examination.  Her menses are absent due to [facility] for endometriosis in 2005. She does not have PMB.  She does not have vasomotor sx if takes estradiol 2 mg daily; has night sweats about once monthly. Not ready to wean down.  Sex activity: not sexually active. Denies vag dryness. Has increased vag d/c without itching/irritation/odor.  Hx of kidney stones and chronic renal insufficiency stage 3B, seeing nephrology.  Also with heart palpitations, seeing cardio.  Last Pap: 11/23/18 Results: no abnormalities/neg HPV DNA Hx of STDs: none  Last mammogram: 06/30/22  Results were: normal--routine follow-up in 12 months There is no FH of breast cancer. There is a FH of ovarian cancer in her MGM, genetic testing not done and declined by pt. The patient does not do self-breast exams. S/p breast reduction.    Colonoscopy: never; NEG cologuard 2022, repeat in 3 yrs; had GI testing recently because can't have colonoscopy per pt  Tobacco use: The patient denies current or previous tobacco use. Alcohol use: none  No drug use Exercise: mod active  She does get adequate calcium and Vitamin D in her diet.  Labs with PCP.   Past Medical History:  Diagnosis Date   Anxiety    Basal cell carcinoma    Depression    Dysplastic nevus 05/09/2020   Right mid back paraspinal. Moderate atypia, close to margin.   Dysplastic nevus 05/09/2020   Right mid to upper back 5cm lat. to spine. Moderate atypia, close to margin.   Dysplastic nevus 01/30/2022   L upper back paraspinal - mild   Dysplastic nevus 01/30/2022   L mid to low back paraspinal - mild   Endometriosis    Epstein Barr virus infection    Hyperlipidemia    Hypertension    Kidney  stones    Migraine    Neuropathy    left breast after reduction mammoplasty, seen at pain clinic    Past Surgical History:  Procedure Laterality Date   ABDOMINAL HYSTERECTOMY     BASAL CELL CARCINOMA EXCISION  2014   right wrist   DIAGNOSTIC LAPAROSCOPY  [id number]   OSIS patent tubes   ELBOW SURGERY     kidney stone removal     LITHOTRIPSY     x4; 05/2016   REDUCTION MAMMAPLASTY  2012   reduction mammoplasty  2012   Dr. Tula Nakayama   TOTAL ABDOMINAL HYSTERECTOMY W/ BILATERAL SALPINGOOPHORECTOMY  2005   adenomyosis and severe endometriosis. Dr Rayford Halsted    Family History  Problem Relation Age of Onset   Hyperlipidemia Mother    Hypertension Mother    Depression Mother    Stroke Mother    Hypertension Father    Diabetes Father    Hyperlipidemia Sister    Hypertension Sister    Depression Sister    Ovarian cancer Maternal Grandmother 77   Breast cancer Neg Hx     Social History   Socioeconomic History   Marital status: Divorced    Spouse name: Not on file   Number of children: 1   Years of education: 16   Highest education level: Not on file  Occupational History   Occupation: Higher education careers adviser  Specialist  Tobacco Use   Smoking status: Never   Smokeless tobacco: Never  Vaping Use   Vaping Use: Never used  Substance and Sexual Activity   Alcohol use: No   Drug use: No   Sexual activity: Not Currently    Birth control/protection: Surgical    Comment: Hysterectomy  Other Topics Concern   Not on file  Social History Narrative   Not on file   Social Determinants of Health   Financial Resource Strain: Not on file  Food Insecurity: Not on file  Transportation Needs: Not on file  Physical Activity: Not on file  Stress: Not on file  Social Connections: Not on file  Intimate Partner Violence: Not on file    Outpatient Medications Prior to Visit  Medication Sig Dispense Refill   ALPRAZolam (XANAX) 0.5 MG tablet Take by mouth.     amitriptyline (ELAVIL) 25 MG tablet Take 1  tablet by mouth daily.     buprenorphine (BUTRANS) 5 MCG/HR PTWK 1 patch once a week.     buPROPion (WELLBUTRIN SR) 150 MG 12 hr tablet Take 150 mg by mouth 2 (two) times daily.     butalbital-acetaminophen-caffeine (FIORICET, ESGIC) 50-325-40 MG tablet Take 2 tablets by mouth every 4 (four) hours as needed.     cyanocobalamin 1000 MCG tablet Take by mouth.     DULoxetine (CYMBALTA) 60 MG capsule Take 60 mg by mouth daily.     ezetimibe (ZETIA) 10 MG tablet Take 1 tablet by mouth daily.     folic acid (FOLVITE) 1 MG tablet Take by mouth.     furosemide (LASIX) 40 MG tablet Take 1.5 tablets by mouth daily.     HYDROcodone-acetaminophen (NORCO) 10-325 MG tablet Take 1 tablet by mouth every 6 (six) hours as needed.     KLOR-CON M20 20 MEQ tablet Take 40 mEq by mouth 2 (two) times daily.     oxybutynin (DITROPAN) 5 MG tablet TAKE 1 TABLET(5 MG) BY MOUTH EVERY 8 HOURS AS NEEDED FOR BLADDER SPASMS 90 tablet 2   potassium citrate (UROCIT-K) 10 MEQ (1080 MG) SR tablet TAKE 2 TABLETS BY MOUTH IN  THE MORNING 200 tablet 3   promethazine (PHENERGAN) 25 MG tablet Take 1 tablet by mouth every 6 (six) hours as needed.     rosuvastatin (CRESTOR) 20 MG tablet Take 1 tablet by mouth daily.     senna (SENOKOT) 8.6 MG tablet Take 1 tablet by mouth every morning.     SUMAtriptan (IMITREX) 100 MG tablet Take 1 tablet by mouth daily as needed.     tamsulosin (FLOMAX) 0.4 MG CAPS capsule Take 1 capsule (0.4 mg total) by mouth daily. 30 capsule 6   topiramate (TOPAMAX) 25 MG tablet Take 3 tablets by mouth 2 (two) times daily.     Vitamin D, Ergocalciferol, (DRISDOL) 1.25 MG (50000 UNIT) CAPS capsule Take by mouth.     estradiol (ESTRACE) 2 MG tablet TAKE 1 TABLET BY MOUTH EVERY DAY 30 tablet 0   atenolol (TENORMIN) 50 MG tablet Take 25 mg by mouth daily. Patient reports taking 1/2 tablet daily     DULoxetine (CYMBALTA) 30 MG capsule Take 1 capsule by mouth daily.     No facility-administered medications prior to  visit.       ROS:  Review of Systems  Constitutional:  Negative for fatigue, fever and unexpected weight change.  Respiratory:  Negative for cough, shortness of breath and wheezing.   Cardiovascular:  Negative for chest pain,  palpitations and leg swelling.  Gastrointestinal:  Negative for blood in stool, constipation, diarrhea, nausea and vomiting.  Endocrine: Negative for cold intolerance, heat intolerance and polyuria.  Genitourinary:  Positive for vaginal discharge. Negative for dyspareunia, dysuria, flank pain, frequency, genital sores, hematuria, menstrual problem, pelvic pain, urgency, vaginal bleeding and vaginal pain.  Musculoskeletal:  Negative for back pain, joint swelling and myalgias.  Skin:  Negative for rash.  Neurological:  Negative for dizziness, syncope, light-headedness, numbness and headaches.  Hematological:  Negative for adenopathy.  Psychiatric/Behavioral:  Negative for agitation, confusion, sleep disturbance and suicidal ideas. The patient is not nervous/anxious.    BREAST: No symptoms    Objective: BP 100/70   Ht '5\' 4"'$  (1.626 m)   Wt 104 lb (47.2 kg)   LMP  (LMP Unknown)   BMI 17.85 kg/m    Physical Exam Constitutional:      Appearance: She is well-developed.  Genitourinary:     Vulva normal.     Genitourinary Comments: UTERUS/CX SURG REM     Right Labia: No rash, tenderness or lesions.    Left Labia: No tenderness, lesions or rash.    Vaginal cuff intact.    No vaginal discharge, erythema or tenderness.      Right Adnexa: not tender and no mass present.    Left Adnexa: not tender and no mass present.    Cervix is absent.     No cervical friability or polyp.     Uterus is not enlarged or tender.     Uterus is absent.  Breasts:    Right: Mass present. No nipple discharge, skin change or tenderness.     Left: No mass, nipple discharge, skin change or tenderness.  Neck:     Thyroid: No thyromegaly.  Cardiovascular:     Rate and Rhythm:  Normal rate and regular rhythm.     Heart sounds: Normal heart sounds. No murmur heard. Pulmonary:     Effort: Pulmonary effort is normal.     Breath sounds: Normal breath sounds.  Chest:    Abdominal:     Palpations: Abdomen is soft.     Tenderness: There is no abdominal tenderness. There is no guarding or rebound.  Musculoskeletal:        General: Normal range of motion.     Cervical back: Normal range of motion.  Lymphadenopathy:     Cervical: No cervical adenopathy.  Neurological:     General: No focal deficit present.     Mental Status: She is alert and oriented to person, place, and time.     Cranial Nerves: No cranial nerve deficit.  Skin:    General: Skin is warm and dry.  Psychiatric:        Mood and Affect: Mood normal.        Behavior: Behavior normal.        Thought Content: Thought content normal.        Judgment: Judgment normal.  Vitals reviewed.     Assessment/Plan: Encounter for annual routine gynecological examination  Cervical cancer screening - Plan: Cytology - PAP  Screening for HPV (human papillomavirus) - Plan: Cytology - PAP  Encounter for screening mammogram for malignant neoplasm of breast; pt is current on scr mammo from 8/23  Mass of upper outer quadrant of right breast - Plan: MM DIAG BREAST TOMO UNI RIGHT, US BREAST LTD UNI RIGHT INC AXILLA; RT breast mass (not noted on 8/23 mammo). Pt to call to schedule dx mammo and u/s.  Will f/u with results. Depending on mass, may need to stop ERT.   Family history of ovarian cancer--MyRisk testing discussed and declined today.   Hormone replacement therapy (HRT) - Plan: estradiol (ESTRACE) 2 MG tablet  Vasomotor symptoms due to menopause - Plan: estradiol (ESTRACE) 2 MG tablet; Rx RF. Discussed weaning down but pt wants to cont current dose. Will f/u prn breast mass info   Meds ordered this encounter  Medications   estradiol (ESTRACE) 2 MG tablet    Sig: Take 1 tablet (2 mg total) by mouth daily.     Dispense:  90 tablet    Refill:  3    Order Specific Question:   Supervising Provider    Answer:   Renaldo Reel           GYN counsel breast self exam, mammography screening, menopause, adequate intake of calcium and vitamin D, diet and exercise    F/U  Return in about 1 year (around 09/16/2023).  Makayela Secrest B. Beverlyann Broxterman, PA-C 09/15/2022 11:29 AM

## 2022-09-12 ENCOUNTER — Ambulatory Visit: Payer: 59 | Admitting: Dietician

## 2022-09-15 ENCOUNTER — Ambulatory Visit (INDEPENDENT_AMBULATORY_CARE_PROVIDER_SITE_OTHER): Payer: 59 | Admitting: Obstetrics and Gynecology

## 2022-09-15 ENCOUNTER — Other Ambulatory Visit (HOSPITAL_COMMUNITY)
Admission: RE | Admit: 2022-09-15 | Discharge: 2022-09-15 | Disposition: A | Discharge status: Home / Self Care | Payer: 59 | Source: Ambulatory Visit | Attending: Obstetrics and Gynecology | Admitting: Obstetrics and Gynecology

## 2022-09-15 ENCOUNTER — Encounter: Payer: Self-pay | Admitting: Obstetrics and Gynecology

## 2022-09-15 VITALS — BP 100/70 | Ht 64.0 in | Wt 104.0 lb

## 2022-09-15 DIAGNOSIS — Z1151 Encounter for screening for human papillomavirus (HPV): Secondary | ICD-10-CM | POA: Insufficient documentation

## 2022-09-15 DIAGNOSIS — Z124 Encounter for screening for malignant neoplasm of cervix: Secondary | ICD-10-CM | POA: Insufficient documentation

## 2022-09-15 DIAGNOSIS — Z79890 Hormone replacement therapy (postmenopausal): Secondary | ICD-10-CM

## 2022-09-15 DIAGNOSIS — N6311 Unspecified lump in the right breast, upper outer quadrant: Secondary | ICD-10-CM

## 2022-09-15 DIAGNOSIS — Z1231 Encounter for screening mammogram for malignant neoplasm of breast: Secondary | ICD-10-CM

## 2022-09-15 DIAGNOSIS — Z01419 Encounter for gynecological examination (general) (routine) without abnormal findings: Secondary | ICD-10-CM

## 2022-09-15 DIAGNOSIS — Z8041 Family history of malignant neoplasm of ovary: Secondary | ICD-10-CM | POA: Diagnosis not present

## 2022-09-15 DIAGNOSIS — N951 Menopausal and female climacteric states: Secondary | ICD-10-CM

## 2022-09-15 DIAGNOSIS — R69 Illness, unspecified: Secondary | ICD-10-CM | POA: Diagnosis not present

## 2022-09-15 MED ORDER — ESTRADIOL 2 MG PO TABS: 2.0000 mg | ORAL_TABLET | Freq: Every day | ORAL | 3 refills | Status: DC

## 2022-09-15 NOTE — Patient Instructions (Signed)
I value your feedback and you entrusting us with your care. If you get a [facility] patient survey, I would appreciate you taking the time to let us know about your experience today. Thank you! ? ? ?

## 2022-09-16 LAB — CYTOLOGY - PAP
Adequacy: ABSENT
Comment: NEGATIVE
Diagnosis: NEGATIVE
High risk HPV: NEGATIVE

## 2022-09-19 DIAGNOSIS — K5903 Drug induced constipation: Secondary | ICD-10-CM | POA: Diagnosis not present

## 2022-09-19 DIAGNOSIS — N1832 Chronic kidney disease, stage 3b: Secondary | ICD-10-CM | POA: Diagnosis not present

## 2022-09-19 DIAGNOSIS — L03011 Cellulitis of right finger: Secondary | ICD-10-CM | POA: Diagnosis not present

## 2022-09-24 DIAGNOSIS — R69 Illness, unspecified: Secondary | ICD-10-CM | POA: Diagnosis not present

## 2022-09-24 DIAGNOSIS — R7309 Other abnormal glucose: Secondary | ICD-10-CM | POA: Diagnosis not present

## 2022-09-24 DIAGNOSIS — Z79899 Other long term (current) drug therapy: Secondary | ICD-10-CM | POA: Diagnosis not present

## 2022-09-24 DIAGNOSIS — I10 Essential (primary) hypertension: Secondary | ICD-10-CM | POA: Diagnosis not present

## 2022-09-24 DIAGNOSIS — G43809 Other migraine, not intractable, without status migrainosus: Secondary | ICD-10-CM | POA: Diagnosis not present

## 2022-09-24 DIAGNOSIS — E782 Mixed hyperlipidemia: Secondary | ICD-10-CM | POA: Diagnosis not present

## 2022-09-24 DIAGNOSIS — N1832 Chronic kidney disease, stage 3b: Secondary | ICD-10-CM | POA: Diagnosis not present

## 2022-09-26 ENCOUNTER — Ambulatory Visit
Admission: RE | Admit: 2022-09-26 | Discharge: 2022-09-26 | Disposition: A | Discharge status: Home / Self Care | Payer: 59 | Source: Ambulatory Visit | Attending: Obstetrics and Gynecology | Admitting: Obstetrics and Gynecology

## 2022-09-26 DIAGNOSIS — N6311 Unspecified lump in the right breast, upper outer quadrant: Secondary | ICD-10-CM | POA: Insufficient documentation

## 2022-09-30 ENCOUNTER — Telehealth: Payer: Self-pay

## 2022-09-30 ENCOUNTER — Other Ambulatory Visit: Payer: Self-pay

## 2022-09-30 MED ORDER — FLUCONAZOLE 150 MG PO TABS: 150.0000 mg | ORAL_TABLET | Freq: Once | ORAL | 0 refills | Status: AC

## 2022-09-30 NOTE — Telephone Encounter (Signed)
Pt calling; had a nail inf; Dr. Vidal Schwalbe rx'd keflex; finished it on Sunday; now has, since Thurs, a yeast inf; is allergic to monistat; can something be called in?  [phone]  Pt states d/c is itchy, cottage cheesy, and smells; has taken diflucan before; adv will send in diflucan but if it doesn't work to be seen by a provider.

## 2022-10-17 DIAGNOSIS — R809 Proteinuria, unspecified: Secondary | ICD-10-CM | POA: Diagnosis not present

## 2022-10-17 DIAGNOSIS — N200 Calculus of kidney: Secondary | ICD-10-CM | POA: Diagnosis not present

## 2022-10-17 DIAGNOSIS — N1831 Chronic kidney disease, stage 3a: Secondary | ICD-10-CM | POA: Diagnosis not present

## 2022-10-28 ENCOUNTER — Encounter: Payer: Self-pay | Admitting: Dermatology

## 2022-10-28 ENCOUNTER — Ambulatory Visit (INDEPENDENT_AMBULATORY_CARE_PROVIDER_SITE_OTHER): Payer: 59 | Admitting: Dermatology

## 2022-10-28 VITALS — BP 137/86 | HR 89

## 2022-10-28 DIAGNOSIS — L988 Other specified disorders of the skin and subcutaneous tissue: Secondary | ICD-10-CM

## 2022-10-28 NOTE — Progress Notes (Signed)
   Follow-Up Visit   Subjective  Patricia Love is a 56 y.o. female who presents for the following: Facial Elastosis (Here for Botox).  The following portions of the chart were reviewed this encounter and updated as appropriate:  Tobacco  Allergies  Meds  Problems  Med Hx  Surg Hx  Fam Hx     Review of Systems: No other skin or systemic complaints except as noted in HPI or Assessment and Plan.  Objective  Well appearing patient in no apparent distress; mood and affect are within normal limits.  A focused examination was performed including face. Relevant physical exam findings are noted in the Assessment and Plan.  face Rhytides and volume loss.       Assessment & Plan  Elastosis of skin face  Botox 25 units injected into frown complex.  Botox Injection - face Location: See attached image  Informed consent: Discussed risks (infection, pain, bleeding, bruising, swelling, allergic reaction, paralysis of nearby muscles, eyelid droop, double vision, neck weakness, difficulty breathing, headache, undesirable cosmetic result, and need for additional treatment) and benefits of the procedure, as well as the alternatives.  Informed consent was obtained.  Preparation: The area was cleansed with alcohol.  Procedure Details:  Botox was injected into the dermis with a 30-gauge needle. Pressure applied to any bleeding. Ice packs offered for swelling.  Lot Number:  [id number] Expiration:  12/2024  Total Units Injected:  25  Plan: Patient was instructed to remain upright for 4 hours. Patient was instructed to avoid massaging the face and avoid vigorous exercise for the rest of the day. Tylenol may be used for headache.  Allow 2 weeks before returning to clinic for additional dosing as needed. Patient will call for any problems.    Return for Botox 3-4 months.  I, Emelia Salisbury, CMA, am acting as scribe for Sarina Ser, MD. Documentation: I have reviewed the above documentation  for accuracy and completeness, and I agree with the above.  Sarina Ser, MD

## 2022-10-28 NOTE — Patient Instructions (Signed)
Due to recent changes in healthcare laws, you may see results of your pathology and/or laboratory studies on MyChart before the doctors have had a chance to review them. We understand that in some cases there may be results that are confusing or concerning to you. Please understand that not all results are received at the same time and often the doctors may need to interpret multiple results in order to provide you with the best plan of care or course of treatment. Therefore, we ask that you please give us 2 business days to thoroughly review all your results before contacting the office for clarification. Should we see a critical lab result, you will be contacted sooner.   If You Need Anything After Your Visit  If you have any questions or concerns for your doctor, please call our main line at [phone] and press option 4 to reach your doctor's medical assistant. If no one answers, please leave a voicemail as directed and we will return your call as soon as possible. Messages left after 4 pm will be answered the following business day.   You may also send us a message via MyChart. We typically respond to MyChart messages within 1-2 business days.  For prescription refills, please ask your pharmacy to contact our office. Our fax number is [phone].  If you have an urgent issue when the clinic is closed that cannot wait until the next business day, you can page your doctor at the number below.    Please note that while we do our best to be available for urgent issues outside of office hours, we are not available 24/7.   If you have an urgent issue and are unable to reach us, you may choose to seek medical care at your doctor's office, retail clinic, urgent care center, or emergency room.  If you have a medical emergency, please immediately call 911 or go to the emergency department.  Pager Numbers  - Dr. Kowalski: [phone]  - Dr. Moye: [phone]  - Dr. Stewart:  [phone]  In the event of inclement weather, please call our main line at [phone] for an update on the status of any delays or closures.  Dermatology Medication Tips: Please keep the boxes that topical medications come in in order to help keep track of the instructions about where and how to use these. Pharmacies typically print the medication instructions only on the boxes and not directly on the medication tubes.   If your medication is too expensive, please contact our office at [phone] option 4 or send us a message through MyChart.   We are unable to tell what your co-pay for medications will be in advance as this is different depending on your insurance coverage. However, we may be able to find a substitute medication at lower cost or fill out paperwork to get insurance to cover a needed medication.   If a prior authorization is required to get your medication covered by your insurance company, please allow us 1-2 business days to complete this process.  Drug prices often vary depending on where the prescription is filled and some pharmacies may offer cheaper prices.  The website www.goodrx.com contains coupons for medications through different pharmacies. The prices here do not account for what the cost may be with help from insurance (it may be cheaper with your insurance), but the website can give you the price if you did not use any insurance.  - You can print the associated coupon and take it with   your prescription to the pharmacy.  - You may also stop by our office during regular business hours and pick up a GoodRx coupon card.  - If you need your prescription sent electronically to a different pharmacy, notify our office through [facility] MyChart or by phone at [phone] option 4.     Si Usted Necesita Algo Despus de Su Visita  Tambin puede enviarnos un mensaje a travs de MyChart. Por lo general respondemos a los mensajes de MyChart en el transcurso de 1 a 2  das hbiles.  Para renovar recetas, por favor pida a su farmacia que se ponga en contacto con nuestra oficina. Nuestro nmero de fax es el [phone].  Si tiene un asunto urgente cuando la clnica est cerrada y que no puede esperar hasta el siguiente da hbil, puede llamar/localizar a su doctor(a) al nmero que aparece a continuacin.   Por favor, tenga en cuenta que aunque hacemos todo lo posible para estar disponibles para asuntos urgentes fuera del horario de oficina, no estamos disponibles las 24 horas del da, los 7 das de la semana.   Si tiene un problema urgente y no puede comunicarse con nosotros, puede optar por buscar atencin mdica  en el consultorio de su doctor(a), en una clnica privada, en un centro de atencin urgente o en una sala de emergencias.  Si tiene una emergencia mdica, por favor llame inmediatamente al 911 o vaya a la sala de emergencias.  Nmeros de bper  - Dr. Kowalski: [phone]  - Dra. Moye: [phone]  - Dra. Stewart: [phone]  En caso de inclemencias del tiempo, por favor llame a nuestra lnea principal al [phone] para una actualizacin sobre el estado de cualquier retraso o cierre.  Consejos para la medicacin en dermatologa: Por favor, guarde las cajas en las que vienen los medicamentos de uso tpico para ayudarle a seguir las instrucciones sobre dnde y cmo usarlos. Las farmacias generalmente imprimen las instrucciones del medicamento slo en las cajas y no directamente en los tubos del medicamento.   Si su medicamento es muy caro, por favor, pngase en contacto con nuestra oficina llamando al [phone] y presione la opcin 4 o envenos un mensaje a travs de MyChart.   No podemos decirle cul ser su copago por los medicamentos por adelantado ya que esto es diferente dependiendo de la cobertura de su seguro. Sin embargo, es posible que podamos encontrar un medicamento sustituto a menor costo o llenar un formulario para que el  seguro cubra el medicamento que se considera necesario.   Si se requiere una autorizacin previa para que su compaa de seguros cubra su medicamento, por favor permtanos de 1 a 2 das hbiles para completar este proceso.  Los precios de los medicamentos varan con frecuencia dependiendo del lugar de dnde se surte la receta y alguna farmacias pueden ofrecer precios ms baratos.  El sitio web www.goodrx.com tiene cupones para medicamentos de diferentes farmacias. Los precios aqu no tienen en cuenta lo que podra costar con la ayuda del seguro (puede ser ms barato con su seguro), pero el sitio web puede darle el precio si no utiliz ningn seguro.  - Puede imprimir el cupn correspondiente y llevarlo con su receta a la farmacia.  - Tambin puede pasar por nuestra oficina durante el horario de atencin regular y recoger una tarjeta de cupones de GoodRx.  - Si necesita que su receta se enve electrnicamente a una farmacia diferente, informe a nuestra oficina a travs de MyChart de [facility]   o por telfono llamando al [phone] y presione la opcin 4.  

## 2022-11-07 ENCOUNTER — Encounter: Payer: Self-pay | Admitting: Dermatology

## 2022-11-12 DIAGNOSIS — M542 Cervicalgia: Secondary | ICD-10-CM | POA: Diagnosis not present

## 2022-11-12 DIAGNOSIS — G894 Chronic pain syndrome: Secondary | ICD-10-CM | POA: Diagnosis not present

## 2022-11-12 DIAGNOSIS — Z79899 Other long term (current) drug therapy: Secondary | ICD-10-CM | POA: Diagnosis not present

## 2022-11-12 DIAGNOSIS — Z79891 Long term (current) use of opiate analgesic: Secondary | ICD-10-CM | POA: Diagnosis not present

## 2022-11-12 DIAGNOSIS — M792 Neuralgia and neuritis, unspecified: Secondary | ICD-10-CM | POA: Diagnosis not present

## 2022-11-12 DIAGNOSIS — M791 Myalgia, unspecified site: Secondary | ICD-10-CM | POA: Diagnosis not present

## 2022-11-12 DIAGNOSIS — K5900 Constipation, unspecified: Secondary | ICD-10-CM | POA: Diagnosis not present

## 2022-11-12 DIAGNOSIS — N23 Unspecified renal colic: Secondary | ICD-10-CM | POA: Diagnosis not present

## 2022-12-19 DIAGNOSIS — R7309 Other abnormal glucose: Secondary | ICD-10-CM | POA: Diagnosis not present

## 2022-12-19 DIAGNOSIS — Z79899 Other long term (current) drug therapy: Secondary | ICD-10-CM | POA: Diagnosis not present

## 2022-12-19 DIAGNOSIS — R8290 Unspecified abnormal findings in urine: Secondary | ICD-10-CM | POA: Diagnosis not present

## 2022-12-19 DIAGNOSIS — I10 Essential (primary) hypertension: Secondary | ICD-10-CM | POA: Diagnosis not present

## 2022-12-19 DIAGNOSIS — E782 Mixed hyperlipidemia: Secondary | ICD-10-CM | POA: Diagnosis not present

## 2023-01-02 DIAGNOSIS — F419 Anxiety disorder, unspecified: Secondary | ICD-10-CM | POA: Diagnosis not present

## 2023-01-02 DIAGNOSIS — N1832 Chronic kidney disease, stage 3b: Secondary | ICD-10-CM | POA: Diagnosis not present

## 2023-01-02 DIAGNOSIS — R7309 Other abnormal glucose: Secondary | ICD-10-CM | POA: Diagnosis not present

## 2023-01-02 DIAGNOSIS — N390 Urinary tract infection, site not specified: Secondary | ICD-10-CM | POA: Diagnosis not present

## 2023-01-02 DIAGNOSIS — E782 Mixed hyperlipidemia: Secondary | ICD-10-CM | POA: Diagnosis not present

## 2023-01-02 DIAGNOSIS — I10 Essential (primary) hypertension: Secondary | ICD-10-CM | POA: Diagnosis not present

## 2023-01-04 ENCOUNTER — Other Ambulatory Visit: Payer: Self-pay | Admitting: Urology

## 2023-01-07 DIAGNOSIS — K5900 Constipation, unspecified: Secondary | ICD-10-CM | POA: Diagnosis not present

## 2023-01-07 DIAGNOSIS — M542 Cervicalgia: Secondary | ICD-10-CM | POA: Diagnosis not present

## 2023-01-07 DIAGNOSIS — Z79891 Long term (current) use of opiate analgesic: Secondary | ICD-10-CM | POA: Diagnosis not present

## 2023-01-07 DIAGNOSIS — M791 Myalgia, unspecified site: Secondary | ICD-10-CM | POA: Diagnosis not present

## 2023-01-07 DIAGNOSIS — M792 Neuralgia and neuritis, unspecified: Secondary | ICD-10-CM | POA: Diagnosis not present

## 2023-01-07 DIAGNOSIS — Z79899 Other long term (current) drug therapy: Secondary | ICD-10-CM | POA: Diagnosis not present

## 2023-01-07 DIAGNOSIS — N23 Unspecified renal colic: Secondary | ICD-10-CM | POA: Diagnosis not present

## 2023-01-07 DIAGNOSIS — G894 Chronic pain syndrome: Secondary | ICD-10-CM | POA: Diagnosis not present

## 2023-01-20 ENCOUNTER — Other Ambulatory Visit: Payer: Self-pay | Admitting: Obstetrics and Gynecology

## 2023-01-20 DIAGNOSIS — Z79890 Hormone replacement therapy (postmenopausal): Secondary | ICD-10-CM

## 2023-01-20 DIAGNOSIS — N951 Menopausal and female climacteric states: Secondary | ICD-10-CM

## 2023-01-20 MED ORDER — ESTRADIOL 2 MG PO TABS: 2.0000 mg | ORAL_TABLET | Freq: Every day | ORAL | 1 refills | Status: DC

## 2023-01-20 NOTE — Progress Notes (Signed)
Rx sent to mail order [facility]

## 2023-01-27 ENCOUNTER — Telehealth: Payer: Self-pay | Admitting: Urology

## 2023-01-27 NOTE — Telephone Encounter (Signed)
Pt called saying her mail order pharmacy was trying to get a hold with [place] regarding [place], [place], OXYBUTIN. [facility], Please call Kaleigh on the status please.  [phone]

## 2023-01-28 ENCOUNTER — Other Ambulatory Visit: Payer: Self-pay | Admitting: *Deleted

## 2023-01-28 DIAGNOSIS — N200 Calculus of kidney: Secondary | ICD-10-CM

## 2023-01-28 MED ORDER — TAMSULOSIN HCL 0.4 MG PO CAPS: 0.4000 mg | ORAL_CAPSULE | Freq: Every day | ORAL | 6 refills | Status: DC

## 2023-01-28 MED ORDER — POTASSIUM CITRATE ER 10 MEQ (1080 MG) PO TBCR: EXTENDED_RELEASE_TABLET | ORAL | 3 refills | Status: DC

## 2023-01-28 NOTE — Telephone Encounter (Signed)
Sent medication in

## 2023-02-02 ENCOUNTER — Ambulatory Visit: Payer: 59 | Admitting: Dermatology

## 2023-02-16 DIAGNOSIS — N200 Calculus of kidney: Secondary | ICD-10-CM | POA: Diagnosis not present

## 2023-02-16 DIAGNOSIS — N1831 Chronic kidney disease, stage 3a: Secondary | ICD-10-CM | POA: Diagnosis not present

## 2023-02-16 DIAGNOSIS — R809 Proteinuria, unspecified: Secondary | ICD-10-CM | POA: Diagnosis not present

## 2023-02-17 DIAGNOSIS — W57XXXA Bitten or stung by nonvenomous insect and other nonvenomous arthropods, initial encounter: Secondary | ICD-10-CM | POA: Diagnosis not present

## 2023-02-17 DIAGNOSIS — S1086XA Insect bite of other specified part of neck, initial encounter: Secondary | ICD-10-CM | POA: Diagnosis not present

## 2023-03-03 ENCOUNTER — Ambulatory Visit (INDEPENDENT_AMBULATORY_CARE_PROVIDER_SITE_OTHER): Payer: Self-pay | Admitting: Dermatology

## 2023-03-03 VITALS — BP 146/97 | HR 87

## 2023-03-03 DIAGNOSIS — L988 Other specified disorders of the skin and subcutaneous tissue: Secondary | ICD-10-CM

## 2023-03-03 NOTE — Patient Instructions (Signed)
Due to recent changes in healthcare laws, you may see results of your pathology and/or laboratory studies on MyChart before the doctors have had a chance to review them. We understand that in some cases there may be results that are confusing or concerning to you. Please understand that not all results are received at the same time and often the doctors may need to interpret multiple results in order to provide you with the best plan of care or course of treatment. Therefore, we ask that you please give us 2 business days to thoroughly review all your results before contacting the office for clarification. Should we see a critical lab result, you will be contacted sooner.   If You Need Anything After Your Visit  If you have any questions or concerns for your doctor, please call our main line at [phone] and press option 4 to reach your doctor's medical assistant. If no one answers, please leave a voicemail as directed and we will return your call as soon as possible. Messages left after 4 pm will be answered the following business day.   You may also send us a message via MyChart. We typically respond to MyChart messages within 1-2 business days.  For prescription refills, please ask your pharmacy to contact our office. Our fax number is [phone].  If you have an urgent issue when the clinic is closed that cannot wait until the next business day, you can page your doctor at the number below.    Please note that while we do our best to be available for urgent issues outside of office hours, we are not available 24/7.   If you have an urgent issue and are unable to reach us, you may choose to seek medical care at your doctor's office, retail clinic, urgent care center, or emergency room.  If you have a medical emergency, please immediately call 911 or go to the emergency department.  Pager Numbers  - Dr. Kowalski: [phone]  - Dr. Moye: [phone]  - Dr. Stewart:  [phone]  In the event of inclement weather, please call our main line at [phone] for an update on the status of any delays or closures.  Dermatology Medication Tips: Please keep the boxes that topical medications come in in order to help keep track of the instructions about where and how to use these. Pharmacies typically print the medication instructions only on the boxes and not directly on the medication tubes.   If your medication is too expensive, please contact our office at [phone] option 4 or send us a message through MyChart.   We are unable to tell what your co-pay for medications will be in advance as this is different depending on your insurance coverage. However, we may be able to find a substitute medication at lower cost or fill out paperwork to get insurance to cover a needed medication.   If a prior authorization is required to get your medication covered by your insurance company, please allow us 1-2 business days to complete this process.  Drug prices often vary depending on where the prescription is filled and some pharmacies may offer cheaper prices.  The website www.goodrx.com contains coupons for medications through different pharmacies. The prices here do not account for what the cost may be with help from insurance (it may be cheaper with your insurance), but the website can give you the price if you did not use any insurance.  - You can print the associated coupon and take it with   your prescription to the pharmacy.  - You may also stop by our office during regular business hours and pick up a GoodRx coupon card.  - If you need your prescription sent electronically to a different pharmacy, notify our office through [facility] MyChart or by phone at [phone] option 4.     Si Usted Necesita Algo Despus de Su Visita  Tambin puede enviarnos un mensaje a travs de MyChart. Por lo general respondemos a los mensajes de MyChart en el transcurso de 1 a 2  das hbiles.  Para renovar recetas, por favor pida a su farmacia que se ponga en contacto con nuestra oficina. Nuestro nmero de fax es el [phone].  Si tiene un asunto urgente cuando la clnica est cerrada y que no puede esperar hasta el siguiente da hbil, puede llamar/localizar a su doctor(a) al nmero que aparece a continuacin.   Por favor, tenga en cuenta que aunque hacemos todo lo posible para estar disponibles para asuntos urgentes fuera del horario de oficina, no estamos disponibles las 24 horas del da, los 7 das de la semana.   Si tiene un problema urgente y no puede comunicarse con nosotros, puede optar por buscar atencin mdica  en el consultorio de su doctor(a), en una clnica privada, en un centro de atencin urgente o en una sala de emergencias.  Si tiene una emergencia mdica, por favor llame inmediatamente al 911 o vaya a la sala de emergencias.  Nmeros de bper  - Dr. Kowalski: [phone]  - Dra. Moye: [phone]  - Dra. Stewart: [phone]  En caso de inclemencias del tiempo, por favor llame a nuestra lnea principal al [phone] para una actualizacin sobre el estado de cualquier retraso o cierre.  Consejos para la medicacin en dermatologa: Por favor, guarde las cajas en las que vienen los medicamentos de uso tpico para ayudarle a seguir las instrucciones sobre dnde y cmo usarlos. Las farmacias generalmente imprimen las instrucciones del medicamento slo en las cajas y no directamente en los tubos del medicamento.   Si su medicamento es muy caro, por favor, pngase en contacto con nuestra oficina llamando al [phone] y presione la opcin 4 o envenos un mensaje a travs de MyChart.   No podemos decirle cul ser su copago por los medicamentos por adelantado ya que esto es diferente dependiendo de la cobertura de su seguro. Sin embargo, es posible que podamos encontrar un medicamento sustituto a menor costo o llenar un formulario para que el  seguro cubra el medicamento que se considera necesario.   Si se requiere una autorizacin previa para que su compaa de seguros cubra su medicamento, por favor permtanos de 1 a 2 das hbiles para completar este proceso.  Los precios de los medicamentos varan con frecuencia dependiendo del lugar de dnde se surte la receta y alguna farmacias pueden ofrecer precios ms baratos.  El sitio web www.goodrx.com tiene cupones para medicamentos de diferentes farmacias. Los precios aqu no tienen en cuenta lo que podra costar con la ayuda del seguro (puede ser ms barato con su seguro), pero el sitio web puede darle el precio si no utiliz ningn seguro.  - Puede imprimir el cupn correspondiente y llevarlo con su receta a la farmacia.  - Tambin puede pasar por nuestra oficina durante el horario de atencin regular y recoger una tarjeta de cupones de GoodRx.  - Si necesita que su receta se enve electrnicamente a una farmacia diferente, informe a nuestra oficina a travs de MyChart de [facility]   o por telfono llamando al [phone] y presione la opcin 4.  

## 2023-03-03 NOTE — Progress Notes (Signed)
   Follow-Up Visit   Subjective  Patricia Love is a 57 y.o. female who presents for the following: Botox for facial elastosis  The following portions of the chart were reviewed this encounter and updated as appropriate: medications, allergies, medical history  Review of Systems:  No other skin or systemic complaints except as noted in HPI or Assessment and Plan.  Objective  Well appearing patient in no apparent distress; mood and affect are within normal limits.  A focused examination was performed of the face.  Relevant physical exam findings are noted in the Assessment and Plan.  Injection map photo    Assessment & Plan    Facial Elastosis  Botox 25 units injected today to: - Frown complex 25 units  Location: frown complex  Informed consent: Discussed risks (infection, pain, bleeding, bruising, swelling, allergic reaction, paralysis of nearby muscles, eyelid droop, double vision, neck weakness, difficulty breathing, headache, undesirable cosmetic result, and need for additional treatment) and benefits of the procedure, as well as the alternatives.  Informed consent was obtained.  Preparation: The area was cleansed with alcohol.  Procedure Details:  Botox was injected into the dermis with a 30-gauge needle. Pressure applied to any bleeding. Ice packs offered for swelling.  Lot Number:  [id number] Expiration:  02/2025  Total Units Injected:  25  Plan: Tylenol may be used for headache.  Allow 2 weeks before returning to clinic for additional dosing as needed. Patient will call for any problems.  HISTORY OF DYSPLASTIC NEVUS No evidence of recurrence today Recommend regular full body skin exams Recommend daily broad spectrum sunscreen SPF 30+ to sun-exposed areas, reapply every 2 hours as needed.  Call if any new or changing lesions are noted between office visits  - Discussed with patient she needs to schedule for TBSE due to her hx of Dysplastic Nevi She says she  understands and will schedule.  Return for 3-51m Botox.  I, Ardis Rowan, RMA, am acting as scribe for Armida Sans, MD .  Documentation: I have reviewed the above documentation for accuracy and completeness, and I agree with the above.  Armida Sans, MD

## 2023-03-04 DIAGNOSIS — R7309 Other abnormal glucose: Secondary | ICD-10-CM | POA: Diagnosis not present

## 2023-03-04 DIAGNOSIS — Z79899 Other long term (current) drug therapy: Secondary | ICD-10-CM | POA: Diagnosis not present

## 2023-03-04 DIAGNOSIS — F321 Major depressive disorder, single episode, moderate: Secondary | ICD-10-CM | POA: Diagnosis not present

## 2023-03-04 DIAGNOSIS — R002 Palpitations: Secondary | ICD-10-CM | POA: Diagnosis not present

## 2023-03-04 DIAGNOSIS — N1832 Chronic kidney disease, stage 3b: Secondary | ICD-10-CM | POA: Diagnosis not present

## 2023-03-04 DIAGNOSIS — I10 Essential (primary) hypertension: Secondary | ICD-10-CM | POA: Diagnosis not present

## 2023-03-04 DIAGNOSIS — F419 Anxiety disorder, unspecified: Secondary | ICD-10-CM | POA: Diagnosis not present

## 2023-03-04 DIAGNOSIS — E782 Mixed hyperlipidemia: Secondary | ICD-10-CM | POA: Diagnosis not present

## 2023-03-10 ENCOUNTER — Encounter: Payer: Self-pay | Admitting: Dermatology

## 2023-03-11 DIAGNOSIS — M791 Myalgia, unspecified site: Secondary | ICD-10-CM | POA: Diagnosis not present

## 2023-03-11 DIAGNOSIS — M542 Cervicalgia: Secondary | ICD-10-CM | POA: Diagnosis not present

## 2023-03-11 DIAGNOSIS — N23 Unspecified renal colic: Secondary | ICD-10-CM | POA: Diagnosis not present

## 2023-03-11 DIAGNOSIS — Z79891 Long term (current) use of opiate analgesic: Secondary | ICD-10-CM | POA: Diagnosis not present

## 2023-03-11 DIAGNOSIS — K5900 Constipation, unspecified: Secondary | ICD-10-CM | POA: Diagnosis not present

## 2023-03-11 DIAGNOSIS — Z79899 Other long term (current) drug therapy: Secondary | ICD-10-CM | POA: Diagnosis not present

## 2023-03-11 DIAGNOSIS — M792 Neuralgia and neuritis, unspecified: Secondary | ICD-10-CM | POA: Diagnosis not present

## 2023-03-11 DIAGNOSIS — G894 Chronic pain syndrome: Secondary | ICD-10-CM | POA: Diagnosis not present

## 2023-03-31 DIAGNOSIS — R002 Palpitations: Secondary | ICD-10-CM | POA: Diagnosis not present

## 2023-04-02 DIAGNOSIS — N182 Chronic kidney disease, stage 2 (mild): Secondary | ICD-10-CM | POA: Diagnosis not present

## 2023-04-02 DIAGNOSIS — I25118 Atherosclerotic heart disease of native coronary artery with other forms of angina pectoris: Secondary | ICD-10-CM | POA: Diagnosis not present

## 2023-04-02 DIAGNOSIS — E782 Mixed hyperlipidemia: Secondary | ICD-10-CM | POA: Diagnosis not present

## 2023-04-02 DIAGNOSIS — I10 Essential (primary) hypertension: Secondary | ICD-10-CM | POA: Diagnosis not present

## 2023-04-02 DIAGNOSIS — F419 Anxiety disorder, unspecified: Secondary | ICD-10-CM | POA: Diagnosis not present

## 2023-04-02 DIAGNOSIS — R7309 Other abnormal glucose: Secondary | ICD-10-CM | POA: Diagnosis not present

## 2023-04-10 DIAGNOSIS — N1832 Chronic kidney disease, stage 3b: Secondary | ICD-10-CM | POA: Diagnosis not present

## 2023-04-10 DIAGNOSIS — R002 Palpitations: Secondary | ICD-10-CM | POA: Diagnosis not present

## 2023-04-10 DIAGNOSIS — I10 Essential (primary) hypertension: Secondary | ICD-10-CM | POA: Diagnosis not present

## 2023-04-10 DIAGNOSIS — R0602 Shortness of breath: Secondary | ICD-10-CM | POA: Diagnosis not present

## 2023-04-10 DIAGNOSIS — E782 Mixed hyperlipidemia: Secondary | ICD-10-CM | POA: Diagnosis not present

## 2023-05-04 DIAGNOSIS — T466X5A Adverse effect of antihyperlipidemic and antiarteriosclerotic drugs, initial encounter: Secondary | ICD-10-CM | POA: Diagnosis not present

## 2023-05-04 DIAGNOSIS — R7309 Other abnormal glucose: Secondary | ICD-10-CM | POA: Diagnosis not present

## 2023-05-04 DIAGNOSIS — R8290 Unspecified abnormal findings in urine: Secondary | ICD-10-CM | POA: Diagnosis not present

## 2023-05-04 DIAGNOSIS — Z79899 Other long term (current) drug therapy: Secondary | ICD-10-CM | POA: Diagnosis not present

## 2023-05-04 DIAGNOSIS — N1832 Chronic kidney disease, stage 3b: Secondary | ICD-10-CM | POA: Diagnosis not present

## 2023-05-04 DIAGNOSIS — G720 Drug-induced myopathy: Secondary | ICD-10-CM | POA: Diagnosis not present

## 2023-05-04 DIAGNOSIS — F419 Anxiety disorder, unspecified: Secondary | ICD-10-CM | POA: Diagnosis not present

## 2023-05-04 DIAGNOSIS — E782 Mixed hyperlipidemia: Secondary | ICD-10-CM | POA: Diagnosis not present

## 2023-05-04 DIAGNOSIS — I10 Essential (primary) hypertension: Secondary | ICD-10-CM | POA: Diagnosis not present

## 2023-05-04 DIAGNOSIS — I25118 Atherosclerotic heart disease of native coronary artery with other forms of angina pectoris: Secondary | ICD-10-CM | POA: Diagnosis not present

## 2023-05-05 DIAGNOSIS — R002 Palpitations: Secondary | ICD-10-CM | POA: Diagnosis not present

## 2023-05-06 DIAGNOSIS — N23 Unspecified renal colic: Secondary | ICD-10-CM | POA: Diagnosis not present

## 2023-05-06 DIAGNOSIS — K5900 Constipation, unspecified: Secondary | ICD-10-CM | POA: Diagnosis not present

## 2023-05-06 DIAGNOSIS — G894 Chronic pain syndrome: Secondary | ICD-10-CM | POA: Diagnosis not present

## 2023-05-06 DIAGNOSIS — M792 Neuralgia and neuritis, unspecified: Secondary | ICD-10-CM | POA: Diagnosis not present

## 2023-05-06 DIAGNOSIS — M791 Myalgia, unspecified site: Secondary | ICD-10-CM | POA: Diagnosis not present

## 2023-05-06 DIAGNOSIS — Z79891 Long term (current) use of opiate analgesic: Secondary | ICD-10-CM | POA: Diagnosis not present

## 2023-05-06 DIAGNOSIS — Z79899 Other long term (current) drug therapy: Secondary | ICD-10-CM | POA: Diagnosis not present

## 2023-05-06 DIAGNOSIS — M542 Cervicalgia: Secondary | ICD-10-CM | POA: Diagnosis not present

## 2023-05-12 DIAGNOSIS — T466X5A Adverse effect of antihyperlipidemic and antiarteriosclerotic drugs, initial encounter: Secondary | ICD-10-CM | POA: Diagnosis not present

## 2023-05-12 DIAGNOSIS — R002 Palpitations: Secondary | ICD-10-CM | POA: Diagnosis not present

## 2023-05-12 DIAGNOSIS — E782 Mixed hyperlipidemia: Secondary | ICD-10-CM | POA: Diagnosis not present

## 2023-05-12 DIAGNOSIS — G720 Drug-induced myopathy: Secondary | ICD-10-CM | POA: Diagnosis not present

## 2023-05-12 DIAGNOSIS — R0602 Shortness of breath: Secondary | ICD-10-CM | POA: Diagnosis not present

## 2023-05-12 DIAGNOSIS — N1832 Chronic kidney disease, stage 3b: Secondary | ICD-10-CM | POA: Diagnosis not present

## 2023-05-12 DIAGNOSIS — I25118 Atherosclerotic heart disease of native coronary artery with other forms of angina pectoris: Secondary | ICD-10-CM | POA: Diagnosis not present

## 2023-06-12 ENCOUNTER — Ambulatory Visit
Admission: RE | Admit: 2023-06-12 | Discharge: 2023-06-12 | Disposition: A | Discharge status: Home / Self Care | Payer: 59 | Attending: Urology | Admitting: Urology

## 2023-06-12 ENCOUNTER — Encounter: Payer: Self-pay | Admitting: Urology

## 2023-06-12 ENCOUNTER — Ambulatory Visit: Payer: 59 | Admitting: Urology

## 2023-06-12 ENCOUNTER — Ambulatory Visit
Admission: RE | Admit: 2023-06-12 | Discharge: 2023-06-12 | Disposition: A | Discharge status: Home / Self Care | Payer: 59 | Source: Ambulatory Visit | Attending: Urology | Admitting: Urology

## 2023-06-12 DIAGNOSIS — N200 Calculus of kidney: Secondary | ICD-10-CM

## 2023-06-12 DIAGNOSIS — R109 Unspecified abdominal pain: Secondary | ICD-10-CM | POA: Diagnosis not present

## 2023-06-12 MED ORDER — POTASSIUM CITRATE ER 10 MEQ (1080 MG) PO TBCR
EXTENDED_RELEASE_TABLET | ORAL | 3 refills | Status: DC
Start: 2023-06-12 — End: 2023-09-04

## 2023-06-12 NOTE — Progress Notes (Signed)
I, Maysun Anabel Bene, acting as a scribe for Riki Altes, MD., have documented all relevant documentation on the behalf of Riki Altes, MD, as directed by Riki Altes, MD while in the presence of Riki Altes, MD.  06/12/2023 11:11 AM   Patricia Love [birth year] [id number]  Referring provider: Marguarite Arbour, MD [address] [facility] [place],  [place] [address]  Chief Complaint  Patient presents with   Nephrolithiasis   Urologic history: 1. Right nephrolithiasis 5mm right of pole calculus; 2mm lower pole calculus. On potassium citrate  HPI: Patricia Love is a 57 y.o. female presents for annual follow-up.  After last year's visit, she had a CT of the abdomen pelvis without contrast performed for a weight loss. The right renal calculi were noted and she had no additional right or left renal calculi. No ureteral calculi or obstruction.  She has chronic right groin pain being managed by her PCP. No dysuria or gross hematuria.   PMH: Past Medical History:  Diagnosis Date   Anxiety    Basal cell carcinoma    Depression    Dysplastic nevus 05/09/2020   Right mid back paraspinal. Moderate atypia, close to margin.   Dysplastic nevus 05/09/2020   Right mid to upper back 5cm lat. to spine. Moderate atypia, close to margin.   Dysplastic nevus 01/30/2022   L upper back paraspinal - mild   Dysplastic nevus 01/30/2022   L mid to low back paraspinal - mild   Endometriosis    Epstein Barr virus infection    Hyperlipidemia    Hypertension    Kidney stones    Migraine    Neuropathy    left breast after reduction mammoplasty, seen at pain clinic    Surgical History: Past Surgical History:  Procedure Laterality Date   ABDOMINAL HYSTERECTOMY     BASAL CELL CARCINOMA EXCISION  2014   right wrist   DIAGNOSTIC LAPAROSCOPY  9562;[id number]   OSIS patent tubes   ELBOW SURGERY     kidney stone removal     LITHOTRIPSY     x4; 05/2016   REDUCTION  MAMMAPLASTY  2012   reduction mammoplasty  2012   Dr. Meriam Sprague   TOTAL ABDOMINAL HYSTERECTOMY W/ BILATERAL SALPINGOOPHORECTOMY  2005   adenomyosis and severe endometriosis. Dr Harold Hedge    Home Medications:  Allergies as of 06/12/2023       Reactions   Contrast Media  [iodinated Contrast Media] Anaphylaxis   Meloxicam Swelling   Pregabalin Other (See Comments)   Atorvastatin Other (See Comments)   Elevated cholesterol   Butorphanol Tartrate Other (See Comments)   Patient states she is not allergic to this   Chlorphen-diphenhyd-pe-apap    Other reaction(s): Other (See Comments)   Gabapentin    Gemfibrozil Other (See Comments)   Verapamil Other (See Comments)   Codeine Sulfate Nausea Only   Latex Rash        Medication List        Accurate as of June 12, 2023 11:11 AM. If you have any questions, ask your nurse or doctor.          STOP taking these medications    buprenorphine 5 MCG/HR Ptwk Commonly known as: BUTRANS Stopped by: Riki Altes       TAKE these medications    ALPRAZolam 0.5 MG tablet Commonly known as: XANAX Take by mouth.   amitriptyline 25 MG tablet Commonly known as: ELAVIL Take  1 tablet by mouth daily.   buprenorphine 2 MG Subl SL tablet Commonly known as: SUBUTEX Place 2 mg under the tongue in the morning and at bedtime.   buPROPion 150 MG 12 hr tablet Commonly known as: WELLBUTRIN SR Take 150 mg by mouth 2 (two) times daily.   butalbital-acetaminophen-caffeine 50-325-40 MG tablet Commonly known as: FIORICET Take 2 tablets by mouth every 4 (four) hours as needed.   cyanocobalamin 1000 MCG tablet Take by mouth.   DULoxetine 60 MG capsule Commonly known as: CYMBALTA Take 60 mg by mouth daily.   estradiol 2 MG tablet Commonly known as: ESTRACE Take 1 tablet (2 mg total) by mouth daily.   ezetimibe 10 MG tablet Commonly known as: ZETIA Take 1 tablet by mouth daily.   folic acid 1 MG tablet Commonly known as: FOLVITE Take  by mouth.   furosemide 40 MG tablet Commonly known as: LASIX Take 1.5 tablets by mouth daily.   HYDROcodone-acetaminophen 10-325 MG tablet Commonly known as: NORCO Take 1 tablet by mouth every 6 (six) hours as needed.   Klor-Con M20 20 MEQ tablet Generic drug: potassium chloride SA Take 40 mEq by mouth 2 (two) times daily.   losartan-hydrochlorothiazide 100-12.5 MG tablet Commonly known as: HYZAAR Take 1 tablet by mouth daily.   metoprolol succinate 25 MG 24 hr tablet Commonly known as: TOPROL-XL Take 25 mg by mouth daily.   oxybutynin 5 MG tablet Commonly known as: DITROPAN TAKE 1 TABLET(5 MG) BY MOUTH EVERY 8 HOURS AS NEEDED FOR BLADDER SPASMS   potassium citrate 10 MEQ (1080 MG) SR tablet Commonly known as: UROCIT-K TAKE 2 TABLETS BY MOUTH IN  THE MORNING   promethazine 25 MG tablet Commonly known as: PHENERGAN Take 1 tablet by mouth every 6 (six) hours as needed.   rosuvastatin 20 MG tablet Commonly known as: CRESTOR Take 1 tablet by mouth daily.   senna 8.6 MG tablet Commonly known as: SENOKOT Take 1 tablet by mouth every morning.   SUMAtriptan 100 MG tablet Commonly known as: IMITREX Take 1 tablet by mouth daily as needed.   tamsulosin 0.4 MG Caps capsule Commonly known as: FLOMAX Take 1 capsule (0.4 mg total) by mouth daily.   topiramate 25 MG tablet Commonly known as: TOPAMAX Take 3 tablets by mouth 2 (two) times daily.   Vitamin D (Ergocalciferol) 1.25 MG (50000 UNIT) Caps capsule Commonly known as: DRISDOL Take by mouth.        Allergies:  Allergies  Allergen Reactions   Contrast Media  [Iodinated Contrast Media] Anaphylaxis   Meloxicam Swelling   Pregabalin Other (See Comments)   Atorvastatin Other (See Comments)    Elevated cholesterol   Butorphanol Tartrate Other (See Comments)    Patient states she is not allergic to this   Chlorphen-Diphenhyd-Pe-Apap     Other reaction(s): Other (See Comments)   Gabapentin    Gemfibrozil Other  (See Comments)   Verapamil Other (See Comments)   Codeine Sulfate Nausea Only   Latex Rash    Family History: Family History  Problem Relation Age of Onset   Hyperlipidemia Mother    Hypertension Mother    Depression Mother    Stroke Mother    Hypertension Father    Diabetes Father    Hyperlipidemia Sister    Hypertension Sister    Depression Sister    Ovarian cancer Maternal Grandmother 63   Breast cancer Neg Hx     Social History:  reports that she has never smoked.  She has never used smokeless tobacco. She reports that she does not drink alcohol and does not use drugs.   Physical Exam: BP 100/64   Pulse 91   Ht 5\' 4"  (1.626 m)   Wt 105 lb (47.6 kg)   LMP  (LMP Unknown)   BMI 18.02 kg/m   Constitutional:  Alert and oriented, No acute distress. HEENT: [place] AT, moist mucus membranes.  Trachea midline, no masses. Cardiovascular: No clubbing, cyanosis, or edema. Respiratory: Normal respiratory effort, no increased work of breathing. GI: Abdomen is soft, nontender, nondistended, no abdominal masses Skin: No rashes, bruises or suspicious lesions. Neurologic: Grossly intact, no focal deficits, moving all 4 extremities. Psychiatric: Normal mood and affect.   Pertinent Imaging: KUB performed today was personally reviewed and interpreted. The right pole calculus is easily visualized. The 2mm lower pole calculus is seen on today's x-ray. CT was personally reviewed and interpreted.   CT EXAM: CT ABDOMEN AND PELVIS WITHOUT CONTRAST   TECHNIQUE: Multidetector CT imaging of the abdomen and pelvis was performed following the standard protocol without IV contrast.   RADIATION DOSE REDUCTION: This exam was performed according to the departmental dose-optimization program which includes automated exposure control, adjustment of the mA and/or kV according to patient size and/or use of iterative reconstruction technique.   COMPARISON:  Radiograph 06/13/2022, renal ultrasound  02/11/2021.   FINDINGS: Lower chest: Clear lung bases.  Normal heart size   Hepatobiliary: No evidence of focal liver lesion on this unenhanced exam. The liver is normal in size and density. There are multiple gallstones in the gallbladder. No abnormal gallbladder distension. No pericholecystic inflammation. No biliary dilatation or choledocholithiasis.   Pancreas: No ductal dilatation or inflammation. No evidence of contour deforming mass on this unenhanced exam.   Spleen: Normal in size without focal abnormality.   Adrenals/Urinary Tract: Normal adrenal glands. No hydronephrosis. There is cortical scarring in the right kidney. 2 mm nonobstructing stone in the right lower pole. 6 mm stone in the upper right kidney may be parenchymal or nonobstructing upper pole stone. No definite left intrarenal stones. No perinephric edema or contour deforming renal mass. Unremarkable urinary bladder.   Stomach/Bowel: The stomach is physiologically distended by enteric contrast. There is no gastric wall thickening. The no small bowel obstruction, no small bowel inflammation. The appendix is not confidently visualized, there is no evidence of appendicitis. Administered enteric contrast reaches the hepatic flexure. Moderate formed stool in the ascending and transverse colon. The sigmoid colon is redundant with liquid stool. There is no colonic wall thickening. No evidence of colonic mass, although the distal colon is suboptimally assessed. No significant diverticular disease.   Vascular/Lymphatic: Normal caliber abdominal aorta. There is no bulky abdominopelvic adenopathy, detailed assessment limited due to paucity of intra-abdominal fat.   Reproductive: Status post hysterectomy. No adnexal masses.   Other: No free air or ascites. No abdominal wall hernia. Paucity of intra-abdominal and subcutaneous fat.   Musculoskeletal: There are no acute or suspicious osseous abnormalities.    IMPRESSION: 1. Cholelithiasis without CT findings of acute cholecystitis. 2. Nonobstructing right nephrolithiasis. Right renal cortical scarring. 3. Generalized paucity of body fat.     Electronically Signed   By: Narda Rutherford M.D.   On: 07/31/2022 21:34  Assessment & Plan:    1. Right nephrolithiasis Stable right non-obstructing right renal calculi.  Potassium citrate refilled Continued annual follow-up with KUB or earlier for renal colic/flank pain.  [facility] [address] [place], [place]  [address] [phone]  

## 2023-06-14 ENCOUNTER — Encounter: Payer: Self-pay | Admitting: Urology

## 2023-07-01 DIAGNOSIS — K5900 Constipation, unspecified: Secondary | ICD-10-CM | POA: Diagnosis not present

## 2023-07-01 DIAGNOSIS — M792 Neuralgia and neuritis, unspecified: Secondary | ICD-10-CM | POA: Diagnosis not present

## 2023-07-01 DIAGNOSIS — N23 Unspecified renal colic: Secondary | ICD-10-CM | POA: Diagnosis not present

## 2023-07-01 DIAGNOSIS — M542 Cervicalgia: Secondary | ICD-10-CM | POA: Diagnosis not present

## 2023-07-01 DIAGNOSIS — M791 Myalgia, unspecified site: Secondary | ICD-10-CM | POA: Diagnosis not present

## 2023-07-01 DIAGNOSIS — G894 Chronic pain syndrome: Secondary | ICD-10-CM | POA: Diagnosis not present

## 2023-07-01 DIAGNOSIS — Z79899 Other long term (current) drug therapy: Secondary | ICD-10-CM | POA: Diagnosis not present

## 2023-07-01 DIAGNOSIS — Z79891 Long term (current) use of opiate analgesic: Secondary | ICD-10-CM | POA: Diagnosis not present

## 2023-07-07 DIAGNOSIS — N1831 Chronic kidney disease, stage 3a: Secondary | ICD-10-CM | POA: Diagnosis not present

## 2023-07-07 DIAGNOSIS — R809 Proteinuria, unspecified: Secondary | ICD-10-CM | POA: Diagnosis not present

## 2023-07-07 DIAGNOSIS — I5022 Chronic systolic (congestive) heart failure: Secondary | ICD-10-CM | POA: Diagnosis not present

## 2023-07-07 DIAGNOSIS — N200 Calculus of kidney: Secondary | ICD-10-CM | POA: Diagnosis not present

## 2023-07-16 ENCOUNTER — Other Ambulatory Visit: Payer: Self-pay | Admitting: Obstetrics and Gynecology

## 2023-07-16 ENCOUNTER — Telehealth: Payer: Self-pay

## 2023-07-16 DIAGNOSIS — Z1231 Encounter for screening mammogram for malignant neoplasm of breast: Secondary | ICD-10-CM

## 2023-07-16 DIAGNOSIS — N951 Menopausal and female climacteric states: Secondary | ICD-10-CM

## 2023-07-16 DIAGNOSIS — Z79890 Hormone replacement therapy (postmenopausal): Secondary | ICD-10-CM

## 2023-07-16 MED ORDER — ESTRADIOL 2 MG PO TABS
2.0000 mg | ORAL_TABLET | Freq: Every day | ORAL | 1 refills | Status: DC
Start: 2023-07-16 — End: 2023-09-22

## 2023-07-16 NOTE — Telephone Encounter (Signed)
Pt calling for estradiol refill; is out; not due for annual til November; has scheduled mammogram.  Was told in network of her ins but by the time Nov gets here we may be in network.  [phone].  Pt aware refill eRx'd and she wanted it sent to [facility].

## 2023-08-04 DIAGNOSIS — E782 Mixed hyperlipidemia: Secondary | ICD-10-CM | POA: Diagnosis not present

## 2023-08-04 DIAGNOSIS — N1832 Chronic kidney disease, stage 3b: Secondary | ICD-10-CM | POA: Diagnosis not present

## 2023-08-04 DIAGNOSIS — R5381 Other malaise: Secondary | ICD-10-CM | POA: Diagnosis not present

## 2023-08-04 DIAGNOSIS — I10 Essential (primary) hypertension: Secondary | ICD-10-CM | POA: Diagnosis not present

## 2023-08-04 DIAGNOSIS — G720 Drug-induced myopathy: Secondary | ICD-10-CM | POA: Diagnosis not present

## 2023-08-04 DIAGNOSIS — R7309 Other abnormal glucose: Secondary | ICD-10-CM | POA: Diagnosis not present

## 2023-08-04 DIAGNOSIS — D649 Anemia, unspecified: Secondary | ICD-10-CM | POA: Diagnosis not present

## 2023-08-04 DIAGNOSIS — R5383 Other fatigue: Secondary | ICD-10-CM | POA: Diagnosis not present

## 2023-08-04 DIAGNOSIS — R8290 Unspecified abnormal findings in urine: Secondary | ICD-10-CM | POA: Diagnosis not present

## 2023-08-04 DIAGNOSIS — F419 Anxiety disorder, unspecified: Secondary | ICD-10-CM | POA: Diagnosis not present

## 2023-08-04 DIAGNOSIS — T466X5A Adverse effect of antihyperlipidemic and antiarteriosclerotic drugs, initial encounter: Secondary | ICD-10-CM | POA: Diagnosis not present

## 2023-08-04 DIAGNOSIS — Z23 Encounter for immunization: Secondary | ICD-10-CM | POA: Diagnosis not present

## 2023-08-04 DIAGNOSIS — Z79899 Other long term (current) drug therapy: Secondary | ICD-10-CM | POA: Diagnosis not present

## 2023-08-05 DIAGNOSIS — M791 Myalgia, unspecified site: Secondary | ICD-10-CM | POA: Diagnosis not present

## 2023-08-05 DIAGNOSIS — N23 Unspecified renal colic: Secondary | ICD-10-CM | POA: Diagnosis not present

## 2023-08-05 DIAGNOSIS — G894 Chronic pain syndrome: Secondary | ICD-10-CM | POA: Diagnosis not present

## 2023-08-05 DIAGNOSIS — Z79891 Long term (current) use of opiate analgesic: Secondary | ICD-10-CM | POA: Diagnosis not present

## 2023-08-05 DIAGNOSIS — R8290 Unspecified abnormal findings in urine: Secondary | ICD-10-CM | POA: Diagnosis not present

## 2023-08-05 DIAGNOSIS — M792 Neuralgia and neuritis, unspecified: Secondary | ICD-10-CM | POA: Diagnosis not present

## 2023-08-05 DIAGNOSIS — Z79899 Other long term (current) drug therapy: Secondary | ICD-10-CM | POA: Diagnosis not present

## 2023-08-05 DIAGNOSIS — M542 Cervicalgia: Secondary | ICD-10-CM | POA: Diagnosis not present

## 2023-08-05 DIAGNOSIS — K5900 Constipation, unspecified: Secondary | ICD-10-CM | POA: Diagnosis not present

## 2023-08-06 ENCOUNTER — Ambulatory Visit: Payer: 59

## 2023-08-18 ENCOUNTER — Ambulatory Visit
Admission: RE | Admit: 2023-08-18 | Discharge: 2023-08-18 | Disposition: A | Discharge status: Home / Self Care | Payer: 59 | Source: Ambulatory Visit | Attending: Obstetrics and Gynecology | Admitting: Obstetrics and Gynecology

## 2023-08-18 DIAGNOSIS — Z1231 Encounter for screening mammogram for malignant neoplasm of breast: Secondary | ICD-10-CM | POA: Diagnosis not present

## 2023-09-03 ENCOUNTER — Other Ambulatory Visit: Payer: Self-pay | Admitting: Urology

## 2023-09-03 DIAGNOSIS — N200 Calculus of kidney: Secondary | ICD-10-CM

## 2023-09-07 ENCOUNTER — Telehealth: Payer: Self-pay

## 2023-09-07 NOTE — Telephone Encounter (Signed)
Message left on triage line from [facility] to confirm we want them to fill RX for Urocit-K as patient is already taking Klor-Con from Dr. Judithann Sheen. Please advise.   Ref # [id number] option #2

## 2023-09-08 NOTE — Telephone Encounter (Signed)
Dr. Judithann Sheen office was contacted last week to see if she needed to be on potassium chloride since she was on potassium citrate

## 2023-09-09 NOTE — Telephone Encounter (Signed)
Faxed over a paper for Dr. Judithann Sheen office

## 2023-09-15 DIAGNOSIS — R0602 Shortness of breath: Secondary | ICD-10-CM | POA: Diagnosis not present

## 2023-09-15 DIAGNOSIS — G720 Drug-induced myopathy: Secondary | ICD-10-CM | POA: Diagnosis not present

## 2023-09-15 DIAGNOSIS — R002 Palpitations: Secondary | ICD-10-CM | POA: Diagnosis not present

## 2023-09-15 DIAGNOSIS — I25118 Atherosclerotic heart disease of native coronary artery with other forms of angina pectoris: Secondary | ICD-10-CM | POA: Diagnosis not present

## 2023-09-15 DIAGNOSIS — T466X5A Adverse effect of antihyperlipidemic and antiarteriosclerotic drugs, initial encounter: Secondary | ICD-10-CM | POA: Diagnosis not present

## 2023-09-15 DIAGNOSIS — E782 Mixed hyperlipidemia: Secondary | ICD-10-CM | POA: Diagnosis not present

## 2023-09-15 DIAGNOSIS — N1832 Chronic kidney disease, stage 3b: Secondary | ICD-10-CM | POA: Diagnosis not present

## 2023-09-15 DIAGNOSIS — I10 Essential (primary) hypertension: Secondary | ICD-10-CM | POA: Diagnosis not present

## 2023-09-21 NOTE — Progress Notes (Unsigned)
PCP: Marguarite Arbour, MD   No chief complaint on file.   HPI:      [name] is a 57 y.o. G1P1001 who LMP was No LMP recorded (lmp unknown). Patient has had a hysterectomy., presents today for her annual examination.  Her menses are absent due to [facility] for endometriosis in 2005. She does not have PMB.  She does not have vasomotor sx if takes estradiol 2 mg daily; has night sweats about once monthly. Not ready to wean down.  Sex activity: not sexually active. Denies vag dryness. Has increased vag d/c without itching/irritation/odor.  Hx of kidney stones and chronic renal insufficiency stage 3B, seeing nephrology.  Also with heart palpitations, seeing cardio.  Last Pap: 09/15/22  Results: no abnormalities/neg HPV DNA Hx of STDs: none  Last mammogram: 08/18/23  Results were: normal--routine follow-up in 12 months There is no FH of breast cancer. There is a FH of ovarian cancer in her MGM, genetic testing not done and declined by pt. The patient does not do self-breast exams. S/p breast reduction.    Colonoscopy: never; NEG cologuard 02/2021, repeat in 3 yrs; had GI testing recently because can't have colonoscopy per pt  Tobacco use: The patient denies current or previous tobacco use. Alcohol use: none  No drug use Exercise: mod active  She does get adequate calcium and Vitamin D in her diet.  Labs with PCP.   Past Medical History:  Diagnosis Date   Anxiety    Basal cell carcinoma    Depression    Dysplastic nevus 05/09/2020   Right mid back paraspinal. Moderate atypia, close to margin.   Dysplastic nevus 05/09/2020   Right mid to upper back 5cm lat. to spine. Moderate atypia, close to margin.   Dysplastic nevus 01/30/2022   L upper back paraspinal - mild   Dysplastic nevus 01/30/2022   L mid to low back paraspinal - mild   Endometriosis    Epstein Barr virus infection    Hyperlipidemia    Hypertension    Kidney stones    Migraine    Neuropathy    left  breast after reduction mammoplasty, seen at pain clinic    Past Surgical History:  Procedure Laterality Date   ABDOMINAL HYSTERECTOMY     BASAL CELL CARCINOMA EXCISION  2014   right wrist   DIAGNOSTIC LAPAROSCOPY  8119;[id number]   OSIS patent tubes   ELBOW SURGERY     kidney stone removal     LITHOTRIPSY     x4; 05/2016   REDUCTION MAMMAPLASTY  2012   reduction mammoplasty  2012   Dr. Meriam Sprague   TOTAL ABDOMINAL HYSTERECTOMY W/ BILATERAL SALPINGOOPHORECTOMY  2005   adenomyosis and severe endometriosis. Dr Harold Hedge    Family History  Problem Relation Age of Onset   Hyperlipidemia Mother    Hypertension Mother    Depression Mother    Stroke Mother    Hypertension Father    Diabetes Father    Hyperlipidemia Sister    Hypertension Sister    Depression Sister    Ovarian cancer Maternal Grandmother 77   Breast cancer Neg Hx     Social History   Socioeconomic History   Marital status: Divorced    Spouse name: Not on file   Number of children: 1   Years of education: 16   Highest education level: Not on file  Occupational History   Occupation: Investment banker, operational  Tobacco Use   Smoking status: Never  Smokeless tobacco: Never  Vaping Use   Vaping status: Never Used  Substance and Sexual Activity   Alcohol use: No   Drug use: No   Sexual activity: Not Currently    Birth control/protection: Surgical    Comment: Hysterectomy  Other Topics Concern   Not on file  Social History Narrative   Not on file   Social Determinants of Health   Financial Resource Strain: Patient Declined (08/04/2023)   Received from [facility]   Overall Financial Resource Strain (CARDIA)    Difficulty of Paying Living Expenses: Patient declined  Food Insecurity: Patient Declined (08/04/2023)   Received from [facility]   Hunger Vital Sign    Worried About Running Out of Food in the Last Year: Patient declined    Ran Out of Food in the Last Year: Patient declined   Transportation Needs: Patient Declined (08/04/2023)   Received from [facility] - Transportation    In the past 12 months, has lack of transportation kept you from medical appointments or from getting medications?: Patient declined    Lack of Transportation (Non-Medical): Patient declined  Physical Activity: Not on file  Stress: Not on file  Social Connections: Not on file  Intimate Partner Violence: Not on file    Outpatient Medications Prior to Visit  Medication Sig Dispense Refill   ALPRAZolam (XANAX) 0.5 MG tablet Take by mouth.     amitriptyline (ELAVIL) 25 MG tablet Take 1 tablet by mouth daily.     buprenorphine (SUBUTEX) 2 MG SUBL SL tablet Place 2 mg under the tongue in the morning and at bedtime.     buPROPion (WELLBUTRIN SR) 150 MG 12 hr tablet Take 150 mg by mouth 2 (two) times daily.     butalbital-acetaminophen-caffeine (FIORICET, ESGIC) 50-325-40 MG tablet Take 2 tablets by mouth every 4 (four) hours as needed.     cyanocobalamin 1000 MCG tablet Take by mouth.     DULoxetine (CYMBALTA) 60 MG capsule Take 60 mg by mouth daily.     estradiol (ESTRACE) 2 MG tablet Take 1 tablet (2 mg total) by mouth daily. 90 tablet 1   ezetimibe (ZETIA) 10 MG tablet Take 1 tablet by mouth daily.     folic acid (FOLVITE) 1 MG tablet Take by mouth.     furosemide (LASIX) 40 MG tablet Take 1.5 tablets by mouth daily.     HYDROcodone-acetaminophen (NORCO) 10-325 MG tablet Take 1 tablet by mouth every 6 (six) hours as needed.     losartan-hydrochlorothiazide (HYZAAR) 100-12.5 MG tablet Take 1 tablet by mouth daily.     metoprolol succinate (TOPROL-XL) 25 MG 24 hr tablet Take 25 mg by mouth daily.     oxybutynin (DITROPAN) 5 MG tablet TAKE 1 TABLET(5 MG) BY MOUTH EVERY 8 HOURS AS NEEDED FOR BLADDER SPASMS 90 tablet 2   potassium citrate (UROCIT-K 10) 10 MEQ (1080 MG) SR tablet TAKE 2 TABLETS EVERY       MORNING. 180 tablet 3   promethazine (PHENERGAN) 25 MG tablet Take  1 tablet by mouth every 6 (six) hours as needed.     rosuvastatin (CRESTOR) 20 MG tablet Take 1 tablet by mouth daily.     senna (SENOKOT) 8.6 MG tablet Take 1 tablet by mouth every morning.     SUMAtriptan (IMITREX) 100 MG tablet Take 1 tablet by mouth daily as needed.     tamsulosin (FLOMAX) 0.4 MG CAPS capsule Take 1 capsule (  0.4 mg total) by mouth daily. 30 capsule 6   topiramate (TOPAMAX) 25 MG tablet Take 3 tablets by mouth 2 (two) times daily.     Vitamin D, Ergocalciferol, (DRISDOL) 1.25 MG (50000 UNIT) CAPS capsule Take by mouth.     No facility-administered medications prior to visit.       ROS:  Review of Systems  Constitutional:  Negative for fatigue, fever and unexpected weight change.  Respiratory:  Negative for cough, shortness of breath and wheezing.   Cardiovascular:  Negative for chest pain, palpitations and leg swelling.  Gastrointestinal:  Negative for blood in stool, constipation, diarrhea, nausea and vomiting.  Endocrine: Negative for cold intolerance, heat intolerance and polyuria.  Genitourinary:  Positive for vaginal discharge. Negative for dyspareunia, dysuria, flank pain, frequency, genital sores, hematuria, menstrual problem, pelvic pain, urgency, vaginal bleeding and vaginal pain.  Musculoskeletal:  Negative for back pain, joint swelling and myalgias.  Skin:  Negative for rash.  Neurological:  Negative for dizziness, syncope, light-headedness, numbness and headaches.  Hematological:  Negative for adenopathy.  Psychiatric/Behavioral:  Negative for agitation, confusion, sleep disturbance and suicidal ideas. The patient is not nervous/anxious.    BREAST: No symptoms    Objective: LMP  (LMP Unknown)    Physical Exam Constitutional:      Appearance: She is well-developed.  Genitourinary:     Vulva normal.     Genitourinary Comments: UTERUS/CX SURG REM     Right Labia: No rash, tenderness or lesions.    Left Labia: No tenderness, lesions or rash.     Vaginal cuff intact.    No vaginal discharge, erythema or tenderness.      Right Adnexa: not tender and no mass present.    Left Adnexa: not tender and no mass present.    Cervix is absent.     No cervical friability or polyp.     Uterus is not enlarged or tender.     Uterus is absent.  Breasts:    Right: Mass present. No nipple discharge, skin change or tenderness.     Left: No mass, nipple discharge, skin change or tenderness.  Neck:     Thyroid: No thyromegaly.  Cardiovascular:     Rate and Rhythm: Normal rate and regular rhythm.     Heart sounds: Normal heart sounds. No murmur heard. Pulmonary:     Effort: Pulmonary effort is normal.     Breath sounds: Normal breath sounds.  Chest:    Abdominal:     Palpations: Abdomen is soft.     Tenderness: There is no abdominal tenderness. There is no guarding or rebound.  Musculoskeletal:        General: Normal range of motion.     Cervical back: Normal range of motion.  Lymphadenopathy:     Cervical: No cervical adenopathy.  Neurological:     General: No focal deficit present.     Mental Status: She is alert and oriented to person, place, and time.     Cranial Nerves: No cranial nerve deficit.  Skin:    General: Skin is warm and dry.  Psychiatric:        Mood and Affect: Mood normal.        Behavior: Behavior normal.        Thought Content: Thought content normal.        Judgment: Judgment normal.  Vitals reviewed.     Assessment/Plan: Encounter for annual routine gynecological examination  Cervical cancer screening - Plan: Cytology - PAP  Screening for HPV (human papillomavirus) - Plan: Cytology - PAP  Encounter for screening mammogram for malignant neoplasm of breast; pt is current on scr mammo from 8/23  Mass of upper outer quadrant of right breast - Plan: MM DIAG BREAST TOMO UNI RIGHT, US BREAST LTD UNI RIGHT INC AXILLA; RT breast mass (not noted on 8/23 mammo). Pt to call to schedule dx mammo and u/s. Will f/u  with results. Depending on mass, may need to stop ERT.   Family history of ovarian cancer--MyRisk testing discussed and declined today.   Hormone replacement therapy (HRT) - Plan: estradiol (ESTRACE) 2 MG tablet  Vasomotor symptoms due to menopause - Plan: estradiol (ESTRACE) 2 MG tablet; Rx RF. Discussed weaning down but pt wants to cont current dose. Will f/u prn breast mass info   No orders of the defined types were placed in this encounter.          GYN counsel breast self exam, mammography screening, menopause, adequate intake of calcium and vitamin D, diet and exercise    F/U  No follow-ups on file.  Emilly Lavey B. Mariel Lukins, PA-C 09/21/2023 6:57 PM

## 2023-09-22 ENCOUNTER — Encounter: Payer: Self-pay | Admitting: Obstetrics and Gynecology

## 2023-09-22 ENCOUNTER — Ambulatory Visit (INDEPENDENT_AMBULATORY_CARE_PROVIDER_SITE_OTHER): Payer: 59 | Admitting: Obstetrics and Gynecology

## 2023-09-22 VITALS — BP 106/64 | Ht 66.0 in | Wt 114.0 lb

## 2023-09-22 DIAGNOSIS — Z01419 Encounter for gynecological examination (general) (routine) without abnormal findings: Secondary | ICD-10-CM

## 2023-09-22 DIAGNOSIS — Z87440 Personal history of urinary (tract) infections: Secondary | ICD-10-CM | POA: Diagnosis not present

## 2023-09-22 DIAGNOSIS — N951 Menopausal and female climacteric states: Secondary | ICD-10-CM

## 2023-09-22 DIAGNOSIS — Z79890 Hormone replacement therapy (postmenopausal): Secondary | ICD-10-CM

## 2023-09-22 DIAGNOSIS — Z8041 Family history of malignant neoplasm of ovary: Secondary | ICD-10-CM

## 2023-09-22 DIAGNOSIS — Z1231 Encounter for screening mammogram for malignant neoplasm of breast: Secondary | ICD-10-CM

## 2023-09-22 LAB — POCT URINALYSIS DIPSTICK
Bilirubin, UA: NEGATIVE
Blood, UA: NEGATIVE
Glucose, UA: NEGATIVE
Ketones, UA: NEGATIVE
Leukocytes, UA: NEGATIVE
Nitrite, UA: NEGATIVE
Protein, UA: NEGATIVE
Spec Grav, UA: 1.025 (ref 1.010–1.025)
pH, UA: 5 (ref 5.0–8.0)

## 2023-09-22 MED ORDER — ESTRADIOL 2 MG PO TABS: 2.0000 mg | ORAL_TABLET | Freq: Every day | ORAL | 2 refills | Status: DC

## 2023-09-22 NOTE — Patient Instructions (Signed)
I value your feedback and you entrusting us with your care. If you get a [facility] patient survey, I would appreciate you taking the time to let us know about your experience today. Thank you! ? ? ?

## 2023-09-30 DIAGNOSIS — M542 Cervicalgia: Secondary | ICD-10-CM | POA: Diagnosis not present

## 2023-09-30 DIAGNOSIS — N23 Unspecified renal colic: Secondary | ICD-10-CM | POA: Diagnosis not present

## 2023-09-30 DIAGNOSIS — G894 Chronic pain syndrome: Secondary | ICD-10-CM | POA: Diagnosis not present

## 2023-09-30 DIAGNOSIS — Z79891 Long term (current) use of opiate analgesic: Secondary | ICD-10-CM | POA: Diagnosis not present

## 2023-09-30 DIAGNOSIS — K5900 Constipation, unspecified: Secondary | ICD-10-CM | POA: Diagnosis not present

## 2023-11-09 DIAGNOSIS — I5022 Chronic systolic (congestive) heart failure: Secondary | ICD-10-CM | POA: Diagnosis not present

## 2023-11-09 DIAGNOSIS — N200 Calculus of kidney: Secondary | ICD-10-CM | POA: Diagnosis not present

## 2023-11-09 DIAGNOSIS — R809 Proteinuria, unspecified: Secondary | ICD-10-CM | POA: Diagnosis not present

## 2023-11-09 DIAGNOSIS — N1831 Chronic kidney disease, stage 3a: Secondary | ICD-10-CM | POA: Diagnosis not present

## 2023-11-10 DIAGNOSIS — I5022 Chronic systolic (congestive) heart failure: Secondary | ICD-10-CM | POA: Diagnosis not present

## 2023-11-10 DIAGNOSIS — N200 Calculus of kidney: Secondary | ICD-10-CM | POA: Diagnosis not present

## 2023-11-10 DIAGNOSIS — N182 Chronic kidney disease, stage 2 (mild): Secondary | ICD-10-CM | POA: Diagnosis not present

## 2023-11-13 DIAGNOSIS — T466X5A Adverse effect of antihyperlipidemic and antiarteriosclerotic drugs, initial encounter: Secondary | ICD-10-CM | POA: Diagnosis not present

## 2023-11-13 DIAGNOSIS — Z0000 Encounter for general adult medical examination without abnormal findings: Secondary | ICD-10-CM | POA: Diagnosis not present

## 2023-11-13 DIAGNOSIS — R002 Palpitations: Secondary | ICD-10-CM | POA: Diagnosis not present

## 2023-11-13 DIAGNOSIS — N1832 Chronic kidney disease, stage 3b: Secondary | ICD-10-CM | POA: Diagnosis not present

## 2023-11-13 DIAGNOSIS — E782 Mixed hyperlipidemia: Secondary | ICD-10-CM | POA: Diagnosis not present

## 2023-11-13 DIAGNOSIS — Z79899 Other long term (current) drug therapy: Secondary | ICD-10-CM | POA: Diagnosis not present

## 2023-11-13 DIAGNOSIS — R7303 Prediabetes: Secondary | ICD-10-CM | POA: Diagnosis not present

## 2023-11-13 DIAGNOSIS — I10 Essential (primary) hypertension: Secondary | ICD-10-CM | POA: Diagnosis not present

## 2023-11-13 DIAGNOSIS — I25118 Atherosclerotic heart disease of native coronary artery with other forms of angina pectoris: Secondary | ICD-10-CM | POA: Diagnosis not present

## 2023-11-13 DIAGNOSIS — F419 Anxiety disorder, unspecified: Secondary | ICD-10-CM | POA: Diagnosis not present

## 2023-11-13 DIAGNOSIS — G720 Drug-induced myopathy: Secondary | ICD-10-CM | POA: Diagnosis not present

## 2023-12-03 DIAGNOSIS — G894 Chronic pain syndrome: Secondary | ICD-10-CM | POA: Diagnosis not present

## 2023-12-03 DIAGNOSIS — Z79891 Long term (current) use of opiate analgesic: Secondary | ICD-10-CM | POA: Diagnosis not present

## 2023-12-03 DIAGNOSIS — K5900 Constipation, unspecified: Secondary | ICD-10-CM | POA: Diagnosis not present

## 2023-12-03 DIAGNOSIS — M792 Neuralgia and neuritis, unspecified: Secondary | ICD-10-CM | POA: Diagnosis not present

## 2023-12-03 DIAGNOSIS — N23 Unspecified renal colic: Secondary | ICD-10-CM | POA: Diagnosis not present

## 2023-12-03 DIAGNOSIS — M542 Cervicalgia: Secondary | ICD-10-CM | POA: Diagnosis not present

## 2023-12-07 DIAGNOSIS — R7303 Prediabetes: Secondary | ICD-10-CM | POA: Diagnosis not present

## 2023-12-07 DIAGNOSIS — I10 Essential (primary) hypertension: Secondary | ICD-10-CM | POA: Diagnosis not present

## 2023-12-07 DIAGNOSIS — Z79899 Other long term (current) drug therapy: Secondary | ICD-10-CM | POA: Diagnosis not present

## 2023-12-07 DIAGNOSIS — E782 Mixed hyperlipidemia: Secondary | ICD-10-CM | POA: Diagnosis not present

## 2023-12-14 DIAGNOSIS — T466X5A Adverse effect of antihyperlipidemic and antiarteriosclerotic drugs, initial encounter: Secondary | ICD-10-CM | POA: Diagnosis not present

## 2023-12-14 DIAGNOSIS — R7303 Prediabetes: Secondary | ICD-10-CM | POA: Diagnosis not present

## 2023-12-14 DIAGNOSIS — G720 Drug-induced myopathy: Secondary | ICD-10-CM | POA: Diagnosis not present

## 2023-12-14 DIAGNOSIS — F419 Anxiety disorder, unspecified: Secondary | ICD-10-CM | POA: Diagnosis not present

## 2023-12-14 DIAGNOSIS — I25118 Atherosclerotic heart disease of native coronary artery with other forms of angina pectoris: Secondary | ICD-10-CM | POA: Diagnosis not present

## 2023-12-14 DIAGNOSIS — E782 Mixed hyperlipidemia: Secondary | ICD-10-CM | POA: Diagnosis not present

## 2023-12-14 DIAGNOSIS — I10 Essential (primary) hypertension: Secondary | ICD-10-CM | POA: Diagnosis not present

## 2024-01-28 DIAGNOSIS — Z79899 Other long term (current) drug therapy: Secondary | ICD-10-CM | POA: Diagnosis not present

## 2024-01-28 DIAGNOSIS — N23 Unspecified renal colic: Secondary | ICD-10-CM | POA: Diagnosis not present

## 2024-01-28 DIAGNOSIS — M542 Cervicalgia: Secondary | ICD-10-CM | POA: Diagnosis not present

## 2024-01-28 DIAGNOSIS — M791 Myalgia, unspecified site: Secondary | ICD-10-CM | POA: Diagnosis not present

## 2024-01-28 DIAGNOSIS — Z79891 Long term (current) use of opiate analgesic: Secondary | ICD-10-CM | POA: Diagnosis not present

## 2024-01-28 DIAGNOSIS — G894 Chronic pain syndrome: Secondary | ICD-10-CM | POA: Diagnosis not present

## 2024-01-28 DIAGNOSIS — M792 Neuralgia and neuritis, unspecified: Secondary | ICD-10-CM | POA: Diagnosis not present

## 2024-01-28 DIAGNOSIS — K5900 Constipation, unspecified: Secondary | ICD-10-CM | POA: Diagnosis not present

## 2024-01-29 DIAGNOSIS — I10 Essential (primary) hypertension: Secondary | ICD-10-CM | POA: Diagnosis not present

## 2024-02-05 DIAGNOSIS — I10 Essential (primary) hypertension: Secondary | ICD-10-CM | POA: Diagnosis not present

## 2024-02-05 DIAGNOSIS — E782 Mixed hyperlipidemia: Secondary | ICD-10-CM | POA: Diagnosis not present

## 2024-02-05 DIAGNOSIS — N1832 Chronic kidney disease, stage 3b: Secondary | ICD-10-CM | POA: Diagnosis not present

## 2024-02-05 DIAGNOSIS — R7303 Prediabetes: Secondary | ICD-10-CM | POA: Diagnosis not present

## 2024-02-05 DIAGNOSIS — I25118 Atherosclerotic heart disease of native coronary artery with other forms of angina pectoris: Secondary | ICD-10-CM | POA: Diagnosis not present

## 2024-02-05 DIAGNOSIS — F419 Anxiety disorder, unspecified: Secondary | ICD-10-CM | POA: Diagnosis not present

## 2024-02-05 DIAGNOSIS — T466X5A Adverse effect of antihyperlipidemic and antiarteriosclerotic drugs, initial encounter: Secondary | ICD-10-CM | POA: Diagnosis not present

## 2024-02-05 DIAGNOSIS — G720 Drug-induced myopathy: Secondary | ICD-10-CM | POA: Diagnosis not present

## 2024-02-09 ENCOUNTER — Other Ambulatory Visit: Payer: Self-pay | Admitting: Internal Medicine

## 2024-02-09 DIAGNOSIS — N1832 Chronic kidney disease, stage 3b: Secondary | ICD-10-CM

## 2024-02-12 ENCOUNTER — Ambulatory Visit
Admission: RE | Admit: 2024-02-12 | Discharge: 2024-02-12 | Disposition: A | Discharge status: Home / Self Care | Source: Ambulatory Visit | Attending: Internal Medicine | Admitting: Internal Medicine

## 2024-02-12 DIAGNOSIS — N184 Chronic kidney disease, stage 4 (severe): Secondary | ICD-10-CM | POA: Diagnosis not present

## 2024-02-12 DIAGNOSIS — N200 Calculus of kidney: Secondary | ICD-10-CM | POA: Diagnosis not present

## 2024-02-12 DIAGNOSIS — N1832 Chronic kidney disease, stage 3b: Secondary | ICD-10-CM | POA: Diagnosis not present

## 2024-03-08 DIAGNOSIS — R809 Proteinuria, unspecified: Secondary | ICD-10-CM | POA: Diagnosis not present

## 2024-03-08 DIAGNOSIS — N1831 Chronic kidney disease, stage 3a: Secondary | ICD-10-CM | POA: Diagnosis not present

## 2024-03-14 DIAGNOSIS — N200 Calculus of kidney: Secondary | ICD-10-CM | POA: Diagnosis not present

## 2024-03-14 DIAGNOSIS — R809 Proteinuria, unspecified: Secondary | ICD-10-CM | POA: Diagnosis not present

## 2024-03-14 DIAGNOSIS — I5022 Chronic systolic (congestive) heart failure: Secondary | ICD-10-CM | POA: Diagnosis not present

## 2024-03-14 DIAGNOSIS — N1831 Chronic kidney disease, stage 3a: Secondary | ICD-10-CM | POA: Diagnosis not present

## 2024-03-24 DIAGNOSIS — M791 Myalgia, unspecified site: Secondary | ICD-10-CM | POA: Diagnosis not present

## 2024-03-24 DIAGNOSIS — G894 Chronic pain syndrome: Secondary | ICD-10-CM | POA: Diagnosis not present

## 2024-03-24 DIAGNOSIS — M542 Cervicalgia: Secondary | ICD-10-CM | POA: Diagnosis not present

## 2024-03-24 DIAGNOSIS — Z79899 Other long term (current) drug therapy: Secondary | ICD-10-CM | POA: Diagnosis not present

## 2024-03-24 DIAGNOSIS — M792 Neuralgia and neuritis, unspecified: Secondary | ICD-10-CM | POA: Diagnosis not present

## 2024-03-24 DIAGNOSIS — N23 Unspecified renal colic: Secondary | ICD-10-CM | POA: Diagnosis not present

## 2024-03-24 DIAGNOSIS — Z79891 Long term (current) use of opiate analgesic: Secondary | ICD-10-CM | POA: Diagnosis not present

## 2024-03-24 DIAGNOSIS — K5900 Constipation, unspecified: Secondary | ICD-10-CM | POA: Diagnosis not present

## 2024-03-25 DIAGNOSIS — Z79899 Other long term (current) drug therapy: Secondary | ICD-10-CM | POA: Diagnosis not present

## 2024-03-25 DIAGNOSIS — N1832 Chronic kidney disease, stage 3b: Secondary | ICD-10-CM | POA: Diagnosis not present

## 2024-03-25 DIAGNOSIS — R7303 Prediabetes: Secondary | ICD-10-CM | POA: Diagnosis not present

## 2024-03-25 DIAGNOSIS — I10 Essential (primary) hypertension: Secondary | ICD-10-CM | POA: Diagnosis not present

## 2024-03-25 DIAGNOSIS — I25118 Atherosclerotic heart disease of native coronary artery with other forms of angina pectoris: Secondary | ICD-10-CM | POA: Diagnosis not present

## 2024-03-25 DIAGNOSIS — E782 Mixed hyperlipidemia: Secondary | ICD-10-CM | POA: Diagnosis not present

## 2024-03-25 DIAGNOSIS — G720 Drug-induced myopathy: Secondary | ICD-10-CM | POA: Diagnosis not present

## 2024-03-25 DIAGNOSIS — Z1211 Encounter for screening for malignant neoplasm of colon: Secondary | ICD-10-CM | POA: Diagnosis not present

## 2024-03-25 DIAGNOSIS — T466X5A Adverse effect of antihyperlipidemic and antiarteriosclerotic drugs, initial encounter: Secondary | ICD-10-CM | POA: Diagnosis not present

## 2024-03-25 DIAGNOSIS — F419 Anxiety disorder, unspecified: Secondary | ICD-10-CM | POA: Diagnosis not present

## 2024-03-28 DIAGNOSIS — N1832 Chronic kidney disease, stage 3b: Secondary | ICD-10-CM | POA: Diagnosis not present

## 2024-03-28 DIAGNOSIS — I340 Nonrheumatic mitral (valve) insufficiency: Secondary | ICD-10-CM | POA: Diagnosis not present

## 2024-03-28 DIAGNOSIS — E782 Mixed hyperlipidemia: Secondary | ICD-10-CM | POA: Diagnosis not present

## 2024-03-28 DIAGNOSIS — I10 Essential (primary) hypertension: Secondary | ICD-10-CM | POA: Diagnosis not present

## 2024-03-28 DIAGNOSIS — G720 Drug-induced myopathy: Secondary | ICD-10-CM | POA: Diagnosis not present

## 2024-03-28 DIAGNOSIS — R8290 Unspecified abnormal findings in urine: Secondary | ICD-10-CM | POA: Diagnosis not present

## 2024-03-28 DIAGNOSIS — T466X5A Adverse effect of antihyperlipidemic and antiarteriosclerotic drugs, initial encounter: Secondary | ICD-10-CM | POA: Diagnosis not present

## 2024-03-28 DIAGNOSIS — R399 Unspecified symptoms and signs involving the genitourinary system: Secondary | ICD-10-CM | POA: Diagnosis not present

## 2024-03-28 DIAGNOSIS — R0602 Shortness of breath: Secondary | ICD-10-CM | POA: Diagnosis not present

## 2024-03-28 DIAGNOSIS — R002 Palpitations: Secondary | ICD-10-CM | POA: Diagnosis not present

## 2024-03-28 DIAGNOSIS — G609 Hereditary and idiopathic neuropathy, unspecified: Secondary | ICD-10-CM | POA: Diagnosis not present

## 2024-05-19 DIAGNOSIS — Z79899 Other long term (current) drug therapy: Secondary | ICD-10-CM | POA: Diagnosis not present

## 2024-05-19 DIAGNOSIS — G894 Chronic pain syndrome: Secondary | ICD-10-CM | POA: Diagnosis not present

## 2024-05-19 DIAGNOSIS — M791 Myalgia, unspecified site: Secondary | ICD-10-CM | POA: Diagnosis not present

## 2024-05-19 DIAGNOSIS — M542 Cervicalgia: Secondary | ICD-10-CM | POA: Diagnosis not present

## 2024-05-19 DIAGNOSIS — M792 Neuralgia and neuritis, unspecified: Secondary | ICD-10-CM | POA: Diagnosis not present

## 2024-05-19 DIAGNOSIS — Z79891 Long term (current) use of opiate analgesic: Secondary | ICD-10-CM | POA: Diagnosis not present

## 2024-05-19 DIAGNOSIS — K5900 Constipation, unspecified: Secondary | ICD-10-CM | POA: Diagnosis not present

## 2024-05-19 DIAGNOSIS — N23 Unspecified renal colic: Secondary | ICD-10-CM | POA: Diagnosis not present

## 2024-05-20 DIAGNOSIS — F321 Major depressive disorder, single episode, moderate: Secondary | ICD-10-CM | POA: Diagnosis not present

## 2024-05-20 DIAGNOSIS — T466X5A Adverse effect of antihyperlipidemic and antiarteriosclerotic drugs, initial encounter: Secondary | ICD-10-CM | POA: Diagnosis not present

## 2024-05-20 DIAGNOSIS — E782 Mixed hyperlipidemia: Secondary | ICD-10-CM | POA: Diagnosis not present

## 2024-05-20 DIAGNOSIS — G720 Drug-induced myopathy: Secondary | ICD-10-CM | POA: Diagnosis not present

## 2024-05-20 DIAGNOSIS — I340 Nonrheumatic mitral (valve) insufficiency: Secondary | ICD-10-CM | POA: Diagnosis not present

## 2024-05-20 DIAGNOSIS — F419 Anxiety disorder, unspecified: Secondary | ICD-10-CM | POA: Diagnosis not present

## 2024-05-20 DIAGNOSIS — G609 Hereditary and idiopathic neuropathy, unspecified: Secondary | ICD-10-CM | POA: Diagnosis not present

## 2024-05-20 DIAGNOSIS — N1832 Chronic kidney disease, stage 3b: Secondary | ICD-10-CM | POA: Diagnosis not present

## 2024-05-20 DIAGNOSIS — R0602 Shortness of breath: Secondary | ICD-10-CM | POA: Diagnosis not present

## 2024-05-20 DIAGNOSIS — I252 Old myocardial infarction: Secondary | ICD-10-CM | POA: Diagnosis not present

## 2024-05-24 ENCOUNTER — Other Ambulatory Visit: Payer: Self-pay | Admitting: Urology

## 2024-05-24 DIAGNOSIS — N200 Calculus of kidney: Secondary | ICD-10-CM

## 2024-06-10 ENCOUNTER — Ambulatory Visit
Admission: RE | Admit: 2024-06-10 | Discharge: 2024-06-10 | Disposition: A | Discharge status: Home / Self Care | Source: Ambulatory Visit | Attending: Urology | Admitting: Urology

## 2024-06-10 ENCOUNTER — Telehealth: Payer: Self-pay

## 2024-06-10 ENCOUNTER — Ambulatory Visit: Payer: Self-pay | Admitting: Urology

## 2024-06-10 VITALS — BP 77/48 | HR 83 | Ht 66.0 in | Wt 103.0 lb

## 2024-06-10 DIAGNOSIS — N200 Calculus of kidney: Secondary | ICD-10-CM

## 2024-06-10 DIAGNOSIS — N2889 Other specified disorders of kidney and ureter: Secondary | ICD-10-CM | POA: Diagnosis not present

## 2024-06-10 DIAGNOSIS — R3911 Hesitancy of micturition: Secondary | ICD-10-CM | POA: Diagnosis not present

## 2024-06-10 DIAGNOSIS — N3281 Overactive bladder: Secondary | ICD-10-CM | POA: Diagnosis not present

## 2024-06-10 DIAGNOSIS — K6389 Other specified diseases of intestine: Secondary | ICD-10-CM | POA: Diagnosis not present

## 2024-06-10 LAB — URINALYSIS, COMPLETE
Bilirubin, UA: NEGATIVE
Glucose, UA: NEGATIVE
Ketones, UA: NEGATIVE
Leukocytes,UA: NEGATIVE
Nitrite, UA: NEGATIVE
RBC, UA: NEGATIVE
Specific Gravity, UA: 1.02 (ref 1.005–1.030)
Urobilinogen, Ur: 0.2 mg/dL (ref 0.2–1.0)
pH, UA: 6 (ref 5.0–7.5)

## 2024-06-10 LAB — MICROSCOPIC EXAMINATION: Epithelial Cells (non renal): >10 /HPF — AB (ref 0–10)

## 2024-06-10 MED ORDER — TAMSULOSIN HCL 0.4 MG PO CAPS: 0.4000 mg | ORAL_CAPSULE | Freq: Every day | ORAL | 11 refills | Status: DC

## 2024-06-10 MED ORDER — OXYBUTYNIN CHLORIDE 5 MG PO TABS: ORAL_TABLET | ORAL | 3 refills | Status: AC

## 2024-06-10 NOTE — Telephone Encounter (Signed)
 Called pt's PCP to inform office if hypotension in our office today, pt has had recent changes to BP meds. Pt declines ED evaluation.

## 2024-06-10 NOTE — Progress Notes (Signed)
 06/10/2024 10:40 AM   Patricia Love [place] 01-Feb-1966 [id number]  Referring provider: Auston Reyes BIRCH, MD [address] [facility] [place],  [place] [address]  Chief Complaint  Patient presents with   Nephrolithiasis   Urologic history: 1. Right nephrolithiasis 5mm right of pole calculus; 2mm lower pole calculus. On potassium citrate   HPI: Patricia Love is a 58 y.o. female presents for annual follow-up.  Lab visit Dr. Auston with complaints of urinary frequency, urgency, dysuria with gross hematuria Urinalysis with 182 RBCs on microscopy Urine culture grew MDR E. coli. Treated with a 7-day course of Cipro with resolution of her symptoms Prior to that visit she has been doing well without flank, abdominal or pelvic pain Baseline urinary frequency, urgency and urinary hesitancy.  Prior treatment with immediate release oxybutynin  with significant symptom improvement and she requested a refill.  She has taken tamsulosin  in the past for stone disease and feels the tamsulosin  also helps her urinary hesitancy RUS 02/2024 ordered for CKD monitoring showed no hydronephrosis and a 7 mm right upper pole renal calculus.    PMH: Past Medical History:  Diagnosis Date   Anemia    Anxiety    Basal cell carcinoma    Depression    Dysplastic nevus 05/09/2020   Right mid back paraspinal. Moderate atypia, close to margin.   Dysplastic nevus 05/09/2020   Right mid to upper back 5cm lat. to spine. Moderate atypia, close to margin.   Dysplastic nevus 01/30/2022   L upper back paraspinal - mild   Dysplastic nevus 01/30/2022   L mid to low back paraspinal - mild   Endometriosis    Epstein Barr virus infection    Hyperlipidemia    Hypertension    Kidney stones    Migraine    Neuropathy    left breast after reduction mammoplasty, seen at pain clinic    Surgical History: Past Surgical History:  Procedure Laterality Date   ABDOMINAL HYSTERECTOMY     BASAL CELL CARCINOMA  EXCISION  2014   right wrist   DIAGNOSTIC LAPAROSCOPY  8015;8010   OSIS patent tubes   ELBOW SURGERY     kidney stone removal     LITHOTRIPSY     x4; 05/2016   REDUCTION MAMMAPLASTY  2012   reduction mammoplasty  2012   Dr. Sueann   TOTAL ABDOMINAL HYSTERECTOMY W/ BILATERAL SALPINGOOPHORECTOMY  2005   adenomyosis and severe endometriosis. Dr Hazel    Home Medications:  Allergies as of 06/10/2024       Reactions   Contrast Media  [iodinated Contrast Media] Anaphylaxis   Meloxicam Swelling   Pregabalin Other (See Comments)   Atorvastatin Other (See Comments)   Elevated cholesterol   Butorphanol Tartrate Other (See Comments)   Patient states she is not allergic to this   Chlorphen-diphenhyd-pe-apap    Other reaction(s): Other (See Comments)   Gabapentin    Gemfibrozil Other (See Comments)   Verapamil Other (See Comments)   Codeine Sulfate Nausea Only   Latex Rash        Medication List        Accurate as of June 10, 2024 10:40 AM. If you have any questions, ask your nurse or doctor.          STOP taking these medications    losartan-hydrochlorothiazide 100-12.5 MG tablet Commonly known as: HYZAAR Stopped by: Aleya Durnell C Millie Shorb   oxybutynin  5 MG tablet Commonly known as: DITROPAN  Stopped by: Glendia JAYSON Barba  potassium citrate  10 MEQ (1080 MG) SR tablet Commonly known as: UROCIT-K  Stopped by: Glendia JAYSON Barba   senna 8.6 MG tablet Commonly known as: SENOKOT Stopped by: Glendia JAYSON Barba       TAKE these medications    ALPRAZolam 0.5 MG tablet Commonly known as: XANAX Take 0.5 mg by mouth 3 (three) times daily as needed for anxiety or sleep. What changed: Another medication with the same name was removed. Continue taking this medication, and follow the directions you see here. Changed by: Glendia JAYSON Barba   amitriptyline 25 MG tablet Commonly known as: ELAVIL Take 1 tablet by mouth daily.   buprenorphine 2 MG Subl SL tablet Commonly known as:  SUBUTEX Place 2 mg under the tongue 3 (three) times daily. What changed: Another medication with the same name was removed. Continue taking this medication, and follow the directions you see here. Changed by: Glendia JAYSON Barba   buPROPion 150 MG 12 hr tablet Commonly known as: WELLBUTRIN SR Take 150 mg by mouth 2 (two) times daily.   butalbital-acetaminophen -caffeine 50-325-40 MG tablet Commonly known as: FIORICET Take 2 tablets by mouth every 4 (four) hours as needed.   cyanocobalamin  1000 MCG tablet Take by mouth.   DULoxetine 60 MG capsule Commonly known as: CYMBALTA Take 60 mg by mouth daily.   estradiol  2 MG tablet Commonly known as: ESTRACE  Take 1 tablet (2 mg total) by mouth daily.   ezetimibe 10 MG tablet Commonly known as: ZETIA Take 1 tablet by mouth daily.   flavoxATE 100 MG tablet Commonly known as: URISPAS Take 100 mg by mouth 3 (three) times daily as needed.   folic acid 1 MG tablet Commonly known as: FOLVITE Take by mouth.   HYDROcodone-acetaminophen  10-325 MG tablet Commonly known as: NORCO Take 1 tablet by mouth every 6 (six) hours as needed.   Linzess 290 MCG Caps capsule Generic drug: linaclotide Take 290 mcg by mouth daily.   metoprolol succinate 25 MG 24 hr tablet Commonly known as: TOPROL-XL Take 25 mg by mouth daily.   promethazine 25 MG tablet Commonly known as: PHENERGAN Take 1 tablet by mouth every 6 (six) hours as needed.   rosuvastatin 20 MG tablet Commonly known as: CRESTOR Take 1 tablet by mouth daily.   SUMAtriptan 100 MG tablet Commonly known as: IMITREX Take 1 tablet by mouth daily as needed.   tamsulosin  0.4 MG Caps capsule Commonly known as: FLOMAX  Take 1 capsule (0.4 mg total) by mouth daily.   topiramate 25 MG tablet Commonly known as: TOPAMAX Take 3 tablets by mouth 2 (two) times daily. What changed: how much to take   Vitamin D (Ergocalciferol) 1.25 MG (50000 UNIT) Caps capsule Commonly known as: DRISDOL Take  by mouth.        Allergies:  Allergies  Allergen Reactions   Contrast Media  [Iodinated Contrast Media] Anaphylaxis   Meloxicam Swelling   Pregabalin Other (See Comments)   Atorvastatin Other (See Comments)    Elevated cholesterol   Butorphanol Tartrate Other (See Comments)    Patient states she is not allergic to this   Chlorphen-Diphenhyd-Pe-Apap     Other reaction(s): Other (See Comments)   Gabapentin    Gemfibrozil Other (See Comments)   Verapamil Other (See Comments)   Codeine Sulfate Nausea Only   Latex Rash    Family History: Family History  Problem Relation Age of Onset   Hyperlipidemia Mother    Hypertension Mother    Depression Mother  Stroke Mother    Hypertension Father    Diabetes Father    Hyperlipidemia Sister    Hypertension Sister    Depression Sister    Ovarian cancer Maternal Grandmother 42   Breast cancer Neg Hx     Social History:  reports that she has never smoked. She has never used smokeless tobacco. She reports that she does not drink alcohol and does not use drugs.   Physical Exam: BP (!) 77/48 (BP Location: Left Arm, Patient Position: Sitting, Cuff Size: Normal)   Pulse 83   Ht 5' 6 (1.676 m)   Wt 103 lb (46.7 kg)   LMP  (LMP Unknown)   BMI 16.62 kg/m   Constitutional:  Alert and oriented, No acute distress. HEENT: [place] AT Respiratory: Normal respiratory effort, no increased work of breathing. Psychiatric: Normal mood and affect.   Pertinent Imaging: KUB performed today was personally reviewed and interpreted. The right upper pole calculus is easily visualized and stable. The 2mm lower pole calculus is seen on today's x-ray.  Assessment & Plan:    1. Right nephrolithiasis Stable right non-obstructing right renal calculi.  Continue annual follow-up Call earlier for recurrent stone symptoms  2.  Urinary tract infection Recent UTI with hematuria; symptom resolution after antibiotic therapy UA today without WBC/RBC  3.   Hypotension Systolic BP today 77. Asymptomatic and she declined further evaluation in ED or with PCP  4.  Overactive bladder Oxybutynin  refilled  5.  Urinary hesitancy Tamsulosin  refilled    Glendia JAYSON Barba, MD  [facility] [address] [place], [place] [address] [phone]

## 2024-06-11 ENCOUNTER — Encounter: Payer: Self-pay | Admitting: Urology

## 2024-06-13 ENCOUNTER — Telehealth: Payer: Self-pay | Admitting: Urology

## 2024-06-13 NOTE — Telephone Encounter (Signed)
 Patient called because she forgot to ask Dr. Twylla if he wants her to continue taking Potassium Citrate . Dr. Auston has been prescribing it, but he took her off of it. Pharmacy is [facility] order. Please advise patient.

## 2024-06-17 NOTE — Telephone Encounter (Signed)
 Looks like her serum potassium has been elevated which is the most likely reason Dr. Auston discontinued.  Can put her on a lower dose of over-the-counter potassium citrate .  She can pick up LithoLyte samples.  I have a box on my desk.  She dissolves 1 stick and a 16 ounce beverage daily.  She can order additional at [email or web address]  Schedule lab visit for a potassium level after she has been taking for 1 month

## 2024-06-29 DIAGNOSIS — E782 Mixed hyperlipidemia: Secondary | ICD-10-CM | POA: Diagnosis not present

## 2024-06-29 DIAGNOSIS — N1832 Chronic kidney disease, stage 3b: Secondary | ICD-10-CM | POA: Diagnosis not present

## 2024-06-29 DIAGNOSIS — T466X5A Adverse effect of antihyperlipidemic and antiarteriosclerotic drugs, initial encounter: Secondary | ICD-10-CM | POA: Diagnosis not present

## 2024-06-29 DIAGNOSIS — R634 Abnormal weight loss: Secondary | ICD-10-CM | POA: Diagnosis not present

## 2024-06-29 DIAGNOSIS — I25118 Atherosclerotic heart disease of native coronary artery with other forms of angina pectoris: Secondary | ICD-10-CM | POA: Diagnosis not present

## 2024-06-29 DIAGNOSIS — G43809 Other migraine, not intractable, without status migrainosus: Secondary | ICD-10-CM | POA: Diagnosis not present

## 2024-06-29 DIAGNOSIS — F419 Anxiety disorder, unspecified: Secondary | ICD-10-CM | POA: Diagnosis not present

## 2024-06-29 DIAGNOSIS — Z79899 Other long term (current) drug therapy: Secondary | ICD-10-CM | POA: Diagnosis not present

## 2024-06-29 DIAGNOSIS — I10 Essential (primary) hypertension: Secondary | ICD-10-CM | POA: Diagnosis not present

## 2024-06-29 DIAGNOSIS — G720 Drug-induced myopathy: Secondary | ICD-10-CM | POA: Diagnosis not present

## 2024-06-29 DIAGNOSIS — R0602 Shortness of breath: Secondary | ICD-10-CM | POA: Diagnosis not present

## 2024-06-29 DIAGNOSIS — R7303 Prediabetes: Secondary | ICD-10-CM | POA: Diagnosis not present

## 2024-07-05 DIAGNOSIS — Z79899 Other long term (current) drug therapy: Secondary | ICD-10-CM | POA: Diagnosis not present

## 2024-07-13 DIAGNOSIS — R809 Proteinuria, unspecified: Secondary | ICD-10-CM | POA: Diagnosis not present

## 2024-07-13 DIAGNOSIS — I5022 Chronic systolic (congestive) heart failure: Secondary | ICD-10-CM | POA: Diagnosis not present

## 2024-07-20 DIAGNOSIS — Z79899 Other long term (current) drug therapy: Secondary | ICD-10-CM | POA: Diagnosis not present

## 2024-07-21 ENCOUNTER — Other Ambulatory Visit: Payer: Self-pay

## 2024-07-21 DIAGNOSIS — Z79890 Hormone replacement therapy (postmenopausal): Secondary | ICD-10-CM

## 2024-07-21 DIAGNOSIS — N951 Menopausal and female climacteric states: Secondary | ICD-10-CM

## 2024-07-21 MED ORDER — ESTRADIOL 2 MG PO TABS: 2.0000 mg | ORAL_TABLET | Freq: Every day | ORAL | 0 refills | Status: DC

## 2024-07-21 NOTE — Progress Notes (Signed)
 Patient called to state she didn't realize she was supposed to be weaning down to a lower dose of estradiol  until she found her after visit summary from last November and read the instructions.  She is out of refills and would like another before she is due for her annual in November.  She states she will take as directed on her visit summary.  Refill approved.

## 2024-08-01 DIAGNOSIS — I5022 Chronic systolic (congestive) heart failure: Secondary | ICD-10-CM | POA: Diagnosis not present

## 2024-08-01 DIAGNOSIS — N1831 Chronic kidney disease, stage 3a: Secondary | ICD-10-CM | POA: Diagnosis not present

## 2024-08-01 DIAGNOSIS — R809 Proteinuria, unspecified: Secondary | ICD-10-CM | POA: Diagnosis not present

## 2024-08-01 DIAGNOSIS — N200 Calculus of kidney: Secondary | ICD-10-CM | POA: Diagnosis not present

## 2024-08-03 ENCOUNTER — Other Ambulatory Visit: Payer: Self-pay | Admitting: Internal Medicine

## 2024-08-03 DIAGNOSIS — Z1231 Encounter for screening mammogram for malignant neoplasm of breast: Secondary | ICD-10-CM

## 2024-08-04 DIAGNOSIS — G894 Chronic pain syndrome: Secondary | ICD-10-CM | POA: Diagnosis not present

## 2024-08-04 DIAGNOSIS — Z79891 Long term (current) use of opiate analgesic: Secondary | ICD-10-CM | POA: Diagnosis not present

## 2024-08-04 DIAGNOSIS — M791 Myalgia, unspecified site: Secondary | ICD-10-CM | POA: Diagnosis not present

## 2024-08-04 DIAGNOSIS — M542 Cervicalgia: Secondary | ICD-10-CM | POA: Diagnosis not present

## 2024-08-04 DIAGNOSIS — K5900 Constipation, unspecified: Secondary | ICD-10-CM | POA: Diagnosis not present

## 2024-08-04 DIAGNOSIS — Z79899 Other long term (current) drug therapy: Secondary | ICD-10-CM | POA: Diagnosis not present

## 2024-08-04 DIAGNOSIS — N23 Unspecified renal colic: Secondary | ICD-10-CM | POA: Diagnosis not present

## 2024-08-04 DIAGNOSIS — M792 Neuralgia and neuritis, unspecified: Secondary | ICD-10-CM | POA: Diagnosis not present

## 2024-08-10 DIAGNOSIS — F419 Anxiety disorder, unspecified: Secondary | ICD-10-CM | POA: Diagnosis not present

## 2024-08-10 DIAGNOSIS — R634 Abnormal weight loss: Secondary | ICD-10-CM | POA: Diagnosis not present

## 2024-08-10 DIAGNOSIS — E782 Mixed hyperlipidemia: Secondary | ICD-10-CM | POA: Diagnosis not present

## 2024-08-10 DIAGNOSIS — T466X5A Adverse effect of antihyperlipidemic and antiarteriosclerotic drugs, initial encounter: Secondary | ICD-10-CM | POA: Diagnosis not present

## 2024-08-10 DIAGNOSIS — G720 Drug-induced myopathy: Secondary | ICD-10-CM | POA: Diagnosis not present

## 2024-08-10 DIAGNOSIS — R7303 Prediabetes: Secondary | ICD-10-CM | POA: Diagnosis not present

## 2024-08-10 DIAGNOSIS — N1832 Chronic kidney disease, stage 3b: Secondary | ICD-10-CM | POA: Diagnosis not present

## 2024-08-29 DIAGNOSIS — R399 Unspecified symptoms and signs involving the genitourinary system: Secondary | ICD-10-CM | POA: Diagnosis not present

## 2024-09-05 ENCOUNTER — Ambulatory Visit
Admission: RE | Admit: 2024-09-05 | Discharge: 2024-09-05 | Disposition: A | Discharge status: Home / Self Care | Source: Ambulatory Visit | Attending: Internal Medicine | Admitting: Internal Medicine

## 2024-09-05 DIAGNOSIS — Z1231 Encounter for screening mammogram for malignant neoplasm of breast: Secondary | ICD-10-CM | POA: Diagnosis not present

## 2024-09-26 DIAGNOSIS — N1832 Chronic kidney disease, stage 3b: Secondary | ICD-10-CM | POA: Diagnosis not present

## 2024-09-26 DIAGNOSIS — R002 Palpitations: Secondary | ICD-10-CM | POA: Diagnosis not present

## 2024-09-26 DIAGNOSIS — T466X5A Adverse effect of antihyperlipidemic and antiarteriosclerotic drugs, initial encounter: Secondary | ICD-10-CM | POA: Diagnosis not present

## 2024-09-26 DIAGNOSIS — I340 Nonrheumatic mitral (valve) insufficiency: Secondary | ICD-10-CM | POA: Diagnosis not present

## 2024-09-26 DIAGNOSIS — G609 Hereditary and idiopathic neuropathy, unspecified: Secondary | ICD-10-CM | POA: Diagnosis not present

## 2024-09-26 DIAGNOSIS — R0602 Shortness of breath: Secondary | ICD-10-CM | POA: Diagnosis not present

## 2024-09-26 DIAGNOSIS — I25118 Atherosclerotic heart disease of native coronary artery with other forms of angina pectoris: Secondary | ICD-10-CM | POA: Diagnosis not present

## 2024-09-26 DIAGNOSIS — G720 Drug-induced myopathy: Secondary | ICD-10-CM | POA: Diagnosis not present

## 2024-09-26 DIAGNOSIS — E782 Mixed hyperlipidemia: Secondary | ICD-10-CM | POA: Diagnosis not present

## 2024-09-26 NOTE — Progress Notes (Unsigned)
 PCP: Auston Reyes BIRCH, MD   No chief complaint on file.   HPI:      [name] is a 58 y.o. G1P1001 who LMP was No LMP recorded (lmp unknown). Patient has had a hysterectomy., presents today for her annual examination.  Her menses are absent due to [facility] for endometriosis in 2005. She does not have PMB.  She does not have vasomotor sx if takes estradiol  2 mg daily; was having night sweats about once monthly but more frequent now. Has had several medication changes recently and hx of CKD.   Gets occas RLQ pain; had neg abd/pelvic CT 9/23 with PCP. Long hx of chronic constipation; sx worse recently.   Sex activity: not sexually active. No vag sx.  Hx of kidney stones and chronic renal insufficiency stage 3B, seeing nephrology.  Also with heart palpitations and mitral valve regurg, seeing cardio.  Had UTI with PCP 9/24 but didn't have any sx. Pt would like urine checked today. No UTI sx today.   Last Pap: 09/15/22  Results: no abnormalities/neg HPV DNA Hx of STDs: none  Last mammogram: 09/05/24 with PCP;  Results were: normal--routine follow-up in 12 months There is no FH of breast cancer. There is a FH of ovarian cancer in her MGM, genetic testing not done and declined by pt. The patient does not do self-breast exams. S/p breast reduction. RT breast mass palpated last yr and found to be 1.9 cm calcified area of fat necrosis.   Colonoscopy: never; NEG cologuard 02/2021, repeat in 3 yrs  Tobacco use: The patient denies current or previous tobacco use. Alcohol use: none  No drug use Exercise: mod active  She does get adequate calcium and Vitamin D in her diet.  Labs with PCP.   Past Medical History:  Diagnosis Date   Anemia    Anxiety    Basal cell carcinoma    Depression    Dysplastic nevus 05/09/2020   Right mid back paraspinal. Moderate atypia, close to margin.   Dysplastic nevus 05/09/2020   Right mid to upper back 5cm lat. to spine. Moderate atypia, close to  margin.   Dysplastic nevus 01/30/2022   L upper back paraspinal - mild   Dysplastic nevus 01/30/2022   L mid to low back paraspinal - mild   Endometriosis    Epstein Barr virus infection    Hyperlipidemia    Hypertension    Kidney stones    Migraine    Neuropathy    left breast after reduction mammoplasty, seen at pain clinic    Past Surgical History:  Procedure Laterality Date   ABDOMINAL HYSTERECTOMY     BASAL CELL CARCINOMA EXCISION  2014   right wrist   DIAGNOSTIC LAPAROSCOPY  8015;8010   OSIS patent tubes   ELBOW SURGERY     kidney stone removal     LITHOTRIPSY     x4; 05/2016   REDUCTION MAMMAPLASTY  2012   reduction mammoplasty  2012   Dr. Sueann   TOTAL ABDOMINAL HYSTERECTOMY W/ BILATERAL SALPINGOOPHORECTOMY  2005   adenomyosis and severe endometriosis. Dr Hazel    Family History  Problem Relation Age of Onset   Hyperlipidemia Mother    Hypertension Mother    Depression Mother    Stroke Mother    Hypertension Father    Diabetes Father    Hyperlipidemia Sister    Hypertension Sister    Depression Sister    Ovarian cancer Maternal Grandmother 25  Breast cancer Neg Hx     Social History   Socioeconomic History   Marital status: Divorced    Spouse name: Not on file   Number of children: 1   Years of education: 16   Highest education level: Not on file  Occupational History   Occupation: Investment Banker, Operational  Tobacco Use   Smoking status: Never   Smokeless tobacco: Never  Vaping Use   Vaping status: Never Used  Substance and Sexual Activity   Alcohol use: No   Drug use: No   Sexual activity: Not Currently    Birth control/protection: Surgical    Comment: Hysterectomy  Other Topics Concern   Not on file  Social History Narrative   Not on file   Social Drivers of Health   Financial Resource Strain: Patient Declined (08/04/2023)   Received from [facility]   Overall Financial Resource Strain (CARDIA)    Difficulty of Paying  Living Expenses: Patient declined  Food Insecurity: Patient Declined (08/04/2023)   Received from [facility]   Hunger Vital Sign    Within the past 12 months, you worried that your food would run out before you got the money to buy more.: Patient declined    Within the past 12 months, the food you bought just didn't last and you didn't have money to get more.: Patient declined  Transportation Needs: Patient Declined (08/04/2023)   Received from [facility] - Transportation    In the past 12 months, has lack of transportation kept you from medical appointments or from getting medications?: Patient declined    Lack of Transportation (Non-Medical): Patient declined  Physical Activity: Not on file  Stress: Not on file  Social Connections: Not on file  Intimate Partner Violence: Not on file    Outpatient Medications Prior to Visit  Medication Sig Dispense Refill   ALPRAZolam (XANAX) 0.5 MG tablet Take 0.5 mg by mouth 3 (three) times daily as needed for anxiety or sleep.     amitriptyline (ELAVIL) 25 MG tablet Take 1 tablet by mouth daily.     buprenorphine (SUBUTEX) 2 MG SUBL SL tablet Place 2 mg under the tongue 3 (three) times daily.     buPROPion (WELLBUTRIN SR) 150 MG 12 hr tablet Take 150 mg by mouth 2 (two) times daily.     butalbital-acetaminophen -caffeine (FIORICET, ESGIC) 50-325-40 MG tablet Take 2 tablets by mouth every 4 (four) hours as needed.     cyanocobalamin  1000 MCG tablet Take by mouth.     DULoxetine (CYMBALTA) 60 MG capsule Take 60 mg by mouth daily.     estradiol  (ESTRACE ) 2 MG tablet Take 1 tablet (2 mg total) by mouth daily. 90 tablet 0   ezetimibe (ZETIA) 10 MG tablet Take 1 tablet by mouth daily.     folic acid (FOLVITE) 1 MG tablet Take by mouth.     HYDROcodone-acetaminophen  (NORCO) 10-325 MG tablet Take 1 tablet by mouth every 6 (six) hours as needed.     LINZESS 290 MCG CAPS capsule Take 290 mcg by mouth daily.      metoprolol succinate (TOPROL-XL) 25 MG 24 hr tablet Take 25 mg by mouth daily.     oxybutynin  (DITROPAN ) 5 MG tablet TAKE 1 TABLET(5 MG) BY MOUTH EVERY 8 HOURS AS NEEDED FOR BLADDER SPASMS 90 tablet 3   promethazine (PHENERGAN) 25 MG tablet Take 1 tablet by mouth every 6 (six) hours as needed.  rosuvastatin (CRESTOR) 20 MG tablet Take 1 tablet by mouth daily.     SUMAtriptan (IMITREX) 100 MG tablet Take 1 tablet by mouth daily as needed.     tamsulosin  (FLOMAX ) 0.4 MG CAPS capsule Take 1 capsule (0.4 mg total) by mouth daily. 30 capsule 11   topiramate (TOPAMAX) 25 MG tablet Take 3 tablets by mouth 2 (two) times daily. (Patient taking differently: Take 4 tablets by mouth 2 (two) times daily.)     Vitamin D, Ergocalciferol, (DRISDOL) 1.25 MG (50000 UNIT) CAPS capsule Take by mouth.     No facility-administered medications prior to visit.       ROS:  Review of Systems  Constitutional:  Positive for fatigue. Negative for fever and unexpected weight change.  Respiratory:  Negative for cough, shortness of breath and wheezing.   Cardiovascular:  Negative for chest pain, palpitations and leg swelling.  Gastrointestinal:  Positive for constipation and nausea. Negative for blood in stool, diarrhea and vomiting.  Endocrine: Negative for cold intolerance, heat intolerance and polyuria.  Genitourinary:  Positive for pelvic pain. Negative for dyspareunia, dysuria, flank pain, frequency, genital sores, hematuria, menstrual problem, urgency, vaginal bleeding, vaginal discharge and vaginal pain.  Musculoskeletal:  Negative for back pain, joint swelling and myalgias.  Skin:  Negative for rash.  Neurological:  Negative for dizziness, syncope, light-headedness, numbness and headaches.  Hematological:  Negative for adenopathy.  Psychiatric/Behavioral:  Negative for agitation, confusion, sleep disturbance and suicidal ideas. The patient is not nervous/anxious.    BREAST: No  symptoms    Objective: LMP  (LMP Unknown)    Physical Exam Constitutional:      Appearance: She is well-developed.  Genitourinary:     Vulva normal.     Genitourinary Comments: UTERUS/CX SURG REM     Right Labia: No rash, tenderness or lesions.    Left Labia: No tenderness, lesions or rash.    Vaginal cuff intact.    No vaginal discharge, erythema or tenderness.      Right Adnexa: not tender and no mass present.    Left Adnexa: not tender and no mass present.    Cervix is absent.     Uterus is not enlarged or tender.     Uterus is absent.  Breasts:    Right: No mass, nipple discharge, skin change or tenderness.     Left: No mass, nipple discharge, skin change or tenderness.  Neck:     Thyroid : No thyromegaly.  Cardiovascular:     Rate and Rhythm: Normal rate and regular rhythm.     Heart sounds: Normal heart sounds. No murmur heard. Pulmonary:     Effort: Pulmonary effort is normal.     Breath sounds: Normal breath sounds.  Abdominal:     Palpations: Abdomen is soft.     Tenderness: There is no abdominal tenderness. There is no guarding or rebound.  Musculoskeletal:        General: Normal range of motion.     Cervical back: Normal range of motion.  Lymphadenopathy:     Cervical: No cervical adenopathy.  Neurological:     General: No focal deficit present.     Mental Status: She is alert and oriented to person, place, and time.     Cranial Nerves: No cranial nerve deficit.  Skin:    General: Skin is warm and dry.  Psychiatric:        Mood and Affect: Mood normal.        Behavior: Behavior normal.  Thought Content: Thought content normal.        Judgment: Judgment normal.  Vitals reviewed.    No results found for this or any previous visit (from the past 24 hours).   Assessment/Plan: Encounter for annual routine gynecological examination  Encounter for screening mammogram for malignant neoplasm of breast; pt current on mammo  Family history of  ovarian cancer--MyRisk testing discussed and pt declines. F/u prn.   Vasomotor symptoms due to menopause - Plan: estradiol  (ESTRACE ) 2 MG tablet; sx managed. Having increased night sweats. May be due to recent med changes and CKD. Discussed weaning down to 1 mg dose due to high dose currently and her age. Pt to alternate 1 tab with 1/2 tab every other day for a month, then just go to 1/2 tab (1 mg dose). Rx RF eRxd to mail order. F/u prn.   Hormone replacement therapy (HRT) - Plan: estradiol  (ESTRACE ) 2 MG tablet  History of UTI - Plan: POCT Urinalysis Dipstick  RLQ pain--neg exam. Question GI due to constipation. F/u prn.    No orders of the defined types were placed in this encounter.          GYN counsel breast self exam, mammography screening, menopause, adequate intake of calcium and vitamin D, diet and exercise    F/U  No follow-ups on file.  Miara Emminger B. Karyna Bessler, PA-C 09/26/2024 1:17 PM

## 2024-09-27 ENCOUNTER — Ambulatory Visit (INDEPENDENT_AMBULATORY_CARE_PROVIDER_SITE_OTHER): Admitting: Obstetrics and Gynecology

## 2024-09-27 ENCOUNTER — Encounter: Payer: Self-pay | Admitting: Obstetrics and Gynecology

## 2024-09-27 VITALS — BP 97/61 | HR 75 | Ht 65.0 in | Wt 109.0 lb

## 2024-09-27 DIAGNOSIS — N951 Menopausal and female climacteric states: Secondary | ICD-10-CM

## 2024-09-27 DIAGNOSIS — Z8041 Family history of malignant neoplasm of ovary: Secondary | ICD-10-CM

## 2024-09-27 DIAGNOSIS — Z1211 Encounter for screening for malignant neoplasm of colon: Secondary | ICD-10-CM

## 2024-09-27 DIAGNOSIS — N952 Postmenopausal atrophic vaginitis: Secondary | ICD-10-CM

## 2024-09-27 DIAGNOSIS — Z79890 Hormone replacement therapy (postmenopausal): Secondary | ICD-10-CM | POA: Diagnosis not present

## 2024-09-27 DIAGNOSIS — Z1231 Encounter for screening mammogram for malignant neoplasm of breast: Secondary | ICD-10-CM

## 2024-09-27 DIAGNOSIS — Z87440 Personal history of urinary (tract) infections: Secondary | ICD-10-CM

## 2024-09-27 DIAGNOSIS — N390 Urinary tract infection, site not specified: Secondary | ICD-10-CM

## 2024-09-27 DIAGNOSIS — Z90710 Acquired absence of both cervix and uterus: Secondary | ICD-10-CM

## 2024-09-27 DIAGNOSIS — Z01411 Encounter for gynecological examination (general) (routine) with abnormal findings: Secondary | ICD-10-CM

## 2024-09-27 DIAGNOSIS — Z01419 Encounter for gynecological examination (general) (routine) without abnormal findings: Secondary | ICD-10-CM

## 2024-09-27 MED ORDER — ESTRADIOL 1 MG PO TABS: 2.0000 mg | ORAL_TABLET | Freq: Every day | ORAL | 0 refills | Status: DC

## 2024-09-27 MED ORDER — ESTRADIOL 0.01 % VA CREA: TOPICAL_CREAM | VAGINAL | 0 refills | Status: DC

## 2024-09-27 MED ORDER — ESTRADIOL 1 MG PO TABS: 1.0000 mg | ORAL_TABLET | Freq: Two times a day (BID) | ORAL | 0 refills | Status: DC

## 2024-09-27 MED ORDER — ESTRADIOL 1 MG PO TABS: 1.0000 mg | ORAL_TABLET | Freq: Every day | ORAL | 10 refills | Status: DC

## 2024-09-27 NOTE — Patient Instructions (Signed)
 I value your feedback and you entrusting [place] with your care. If you get a [place] patient survey, I would appreciate you taking the time to let us know about your experience today. Thank you! ? ? ?

## 2024-09-29 DIAGNOSIS — M542 Cervicalgia: Secondary | ICD-10-CM | POA: Diagnosis not present

## 2024-09-29 DIAGNOSIS — Z79899 Other long term (current) drug therapy: Secondary | ICD-10-CM | POA: Diagnosis not present

## 2024-09-29 DIAGNOSIS — G894 Chronic pain syndrome: Secondary | ICD-10-CM | POA: Diagnosis not present

## 2024-09-29 DIAGNOSIS — M792 Neuralgia and neuritis, unspecified: Secondary | ICD-10-CM | POA: Diagnosis not present

## 2024-09-29 DIAGNOSIS — Z79891 Long term (current) use of opiate analgesic: Secondary | ICD-10-CM | POA: Diagnosis not present

## 2024-09-29 DIAGNOSIS — K5900 Constipation, unspecified: Secondary | ICD-10-CM | POA: Diagnosis not present

## 2024-09-29 DIAGNOSIS — N23 Unspecified renal colic: Secondary | ICD-10-CM | POA: Diagnosis not present

## 2024-09-29 DIAGNOSIS — M791 Myalgia, unspecified site: Secondary | ICD-10-CM | POA: Diagnosis not present

## 2024-10-17 DIAGNOSIS — G720 Drug-induced myopathy: Secondary | ICD-10-CM | POA: Diagnosis not present

## 2024-10-17 DIAGNOSIS — N1832 Chronic kidney disease, stage 3b: Secondary | ICD-10-CM | POA: Diagnosis not present

## 2024-10-17 DIAGNOSIS — E782 Mixed hyperlipidemia: Secondary | ICD-10-CM | POA: Diagnosis not present

## 2024-10-17 DIAGNOSIS — G894 Chronic pain syndrome: Secondary | ICD-10-CM | POA: Diagnosis not present

## 2024-10-17 DIAGNOSIS — R399 Unspecified symptoms and signs involving the genitourinary system: Secondary | ICD-10-CM | POA: Diagnosis not present

## 2024-10-17 DIAGNOSIS — F419 Anxiety disorder, unspecified: Secondary | ICD-10-CM | POA: Diagnosis not present

## 2024-10-17 DIAGNOSIS — G43809 Other migraine, not intractable, without status migrainosus: Secondary | ICD-10-CM | POA: Diagnosis not present

## 2024-10-17 DIAGNOSIS — Z79899 Other long term (current) drug therapy: Secondary | ICD-10-CM | POA: Diagnosis not present

## 2024-10-17 DIAGNOSIS — T466X5A Adverse effect of antihyperlipidemic and antiarteriosclerotic drugs, initial encounter: Secondary | ICD-10-CM | POA: Diagnosis not present

## 2024-10-17 DIAGNOSIS — R7303 Prediabetes: Secondary | ICD-10-CM | POA: Diagnosis not present

## 2024-10-17 DIAGNOSIS — I10 Essential (primary) hypertension: Secondary | ICD-10-CM | POA: Diagnosis not present

## 2024-11-19 LAB — COLOGUARD: COLOGUARD: NEGATIVE

## 2024-11-22 ENCOUNTER — Other Ambulatory Visit: Payer: Self-pay

## 2024-11-22 DIAGNOSIS — N952 Postmenopausal atrophic vaginitis: Secondary | ICD-10-CM

## 2024-11-22 DIAGNOSIS — N390 Urinary tract infection, site not specified: Secondary | ICD-10-CM

## 2024-11-22 DIAGNOSIS — N951 Menopausal and female climacteric states: Secondary | ICD-10-CM

## 2024-11-22 DIAGNOSIS — Z79890 Hormone replacement therapy (postmenopausal): Secondary | ICD-10-CM

## 2024-11-22 MED ORDER — ESTRADIOL 0.01 % VA CREA: TOPICAL_CREAM | VAGINAL | 0 refills | Status: AC

## 2024-11-22 MED ORDER — ESTRADIOL 0.01 % VA CREA: TOPICAL_CREAM | VAGINAL | 0 refills | Status: DC

## 2024-11-22 MED ORDER — ESTRADIOL 1 MG PO TABS: 1.0000 mg | ORAL_TABLET | Freq: Every day | ORAL | 10 refills | Status: DC

## 2024-11-22 MED ORDER — ESTRADIOL 1 MG PO TABS: 1.0000 mg | ORAL_TABLET | Freq: Every day | ORAL | 3 refills | Status: DC

## 2024-11-22 MED ORDER — ESTRADIOL 1 MG PO TABS: 1.0000 mg | ORAL_TABLET | Freq: Every day | ORAL | 3 refills | Status: AC

## 2024-11-22 NOTE — Addendum Note (Signed)
 Addended by: KIZZIE CAMELIA CROME on: 11/22/2024 11:40 AM   Modules accepted: Orders

## 2024-11-22 NOTE — Addendum Note (Signed)
 Addended by: KIZZIE CAMELIA CROME on: 11/22/2024 09:23 AM   Modules accepted: Orders
